# Patient Record
Sex: Female | Born: 1945 | Race: Black or African American | Hispanic: No | Marital: Single | Smoking: Never smoker
Health system: Southern US, Community
[De-identification: ages and names within clinical notes are randomized; demographics above are authoritative.]

## PROBLEM LIST (undated history)

## (undated) DIAGNOSIS — E559 Vitamin D deficiency, unspecified: Secondary | ICD-10-CM

## (undated) DIAGNOSIS — E876 Hypokalemia: Secondary | ICD-10-CM

## (undated) DIAGNOSIS — M069 Rheumatoid arthritis, unspecified: Secondary | ICD-10-CM

## (undated) DIAGNOSIS — C801 Malignant (primary) neoplasm, unspecified: Secondary | ICD-10-CM

## (undated) DIAGNOSIS — E049 Nontoxic goiter, unspecified: Secondary | ICD-10-CM

## (undated) DIAGNOSIS — E039 Hypothyroidism, unspecified: Secondary | ICD-10-CM

## (undated) DIAGNOSIS — M1990 Unspecified osteoarthritis, unspecified site: Secondary | ICD-10-CM

## (undated) DIAGNOSIS — Z973 Presence of spectacles and contact lenses: Secondary | ICD-10-CM

## (undated) HISTORY — PX: CATARACT EXTRACTION W/ INTRAOCULAR LENS IMPLANT: SHX1309

## (undated) HISTORY — PX: ABDOMINAL HYSTERECTOMY: SHX81

## (undated) HISTORY — DX: Nontoxic goiter, unspecified: E04.9

## (undated) HISTORY — DX: Hypothyroidism, unspecified: E03.9

## (undated) HISTORY — DX: Hypokalemia: E87.6

---

## 2013-09-13 ENCOUNTER — Other Ambulatory Visit: Payer: Self-pay | Admitting: Orthopaedic Surgery

## 2013-10-14 ENCOUNTER — Encounter (HOSPITAL_COMMUNITY): Payer: Self-pay | Admitting: Pharmacy Technician

## 2013-10-18 ENCOUNTER — Encounter (HOSPITAL_COMMUNITY)
Admission: RE | Admit: 2013-10-18 | Discharge: 2013-10-18 | Disposition: A | Discharge status: Home / Self Care | Payer: Medicare Other | Source: Ambulatory Visit | Attending: Orthopaedic Surgery | Admitting: Orthopaedic Surgery

## 2013-10-18 ENCOUNTER — Encounter (HOSPITAL_COMMUNITY): Payer: Self-pay

## 2013-10-18 DIAGNOSIS — Z01812 Encounter for preprocedural laboratory examination: Secondary | ICD-10-CM | POA: Diagnosis present

## 2013-10-18 DIAGNOSIS — M169 Osteoarthritis of hip, unspecified: Secondary | ICD-10-CM | POA: Diagnosis present

## 2013-10-18 DIAGNOSIS — M1610 Unilateral primary osteoarthritis, unspecified hip: Secondary | ICD-10-CM | POA: Diagnosis present

## 2013-10-18 DIAGNOSIS — Z01810 Encounter for preprocedural cardiovascular examination: Secondary | ICD-10-CM | POA: Diagnosis present

## 2013-10-18 HISTORY — DX: Malignant (primary) neoplasm, unspecified: C80.1

## 2013-10-18 HISTORY — DX: Vitamin D deficiency, unspecified: E55.9

## 2013-10-18 HISTORY — DX: Rheumatoid arthritis, unspecified: M06.9

## 2013-10-18 HISTORY — DX: Presence of spectacles and contact lenses: Z97.3

## 2013-10-18 HISTORY — DX: Unspecified osteoarthritis, unspecified site: M19.90

## 2013-10-18 LAB — TYPE AND SCREEN
ABO/RH(D): O POS
Antibody Screen: NEGATIVE

## 2013-10-18 LAB — CBC WITH DIFFERENTIAL/PLATELET
BASOS PCT: 0 % (ref 0–1)
Basophils Absolute: 0 10*3/uL (ref 0.0–0.1)
EOS ABS: 0 10*3/uL (ref 0.0–0.7)
EOS PCT: 0 % (ref 0–5)
HCT: 38.5 % (ref 36.0–46.0)
Hemoglobin: 12.8 g/dL (ref 12.0–15.0)
LYMPHS ABS: 0.6 10*3/uL — AB (ref 0.7–4.0)
Lymphocytes Relative: 8 % — ABNORMAL LOW (ref 12–46)
MCH: 31.6 pg (ref 26.0–34.0)
MCHC: 33.2 g/dL (ref 30.0–36.0)
MCV: 95.1 fL (ref 78.0–100.0)
MONOS PCT: 3 % (ref 3–12)
Monocytes Absolute: 0.2 10*3/uL (ref 0.1–1.0)
Neutro Abs: 6.8 10*3/uL (ref 1.7–7.7)
Neutrophils Relative %: 89 % — ABNORMAL HIGH (ref 43–77)
Platelets: 216 10*3/uL (ref 150–400)
RBC: 4.05 MIL/uL (ref 3.87–5.11)
RDW: 12.5 % (ref 11.5–15.5)
WBC: 7.7 10*3/uL (ref 4.0–10.5)

## 2013-10-18 LAB — URINALYSIS, ROUTINE W REFLEX MICROSCOPIC
Glucose, UA: NEGATIVE mg/dL
Hgb urine dipstick: NEGATIVE
Ketones, ur: 15 mg/dL — AB
NITRITE: NEGATIVE
PH: 5 (ref 5.0–8.0)
Protein, ur: NEGATIVE mg/dL
SPECIFIC GRAVITY, URINE: 1.026 (ref 1.005–1.030)
Urobilinogen, UA: 1 mg/dL (ref 0.0–1.0)

## 2013-10-18 LAB — BASIC METABOLIC PANEL
Anion gap: 11 (ref 5–15)
BUN: 12 mg/dL (ref 6–23)
CO2: 27 mEq/L (ref 19–32)
CREATININE: 0.84 mg/dL (ref 0.50–1.10)
Calcium: 9.4 mg/dL (ref 8.4–10.5)
Chloride: 104 mEq/L (ref 96–112)
GFR calc Af Amer: 82 mL/min — ABNORMAL LOW (ref >90)
GFR, EST NON AFRICAN AMERICAN: 70 mL/min — AB (ref >90)
GLUCOSE: 106 mg/dL — AB (ref 70–99)
POTASSIUM: 4 meq/L (ref 3.7–5.3)
Sodium: 142 mEq/L (ref 137–147)

## 2013-10-18 LAB — URINE MICROSCOPIC-ADD ON

## 2013-10-18 LAB — SURGICAL PCR SCREEN
MRSA, PCR: NEGATIVE
STAPHYLOCOCCUS AUREUS: NEGATIVE

## 2013-10-18 LAB — APTT: aPTT: 30 seconds (ref 24–37)

## 2013-10-18 LAB — PROTIME-INR
INR: 1.05 (ref 0.00–1.49)
Prothrombin Time: 13.7 seconds (ref 11.6–15.2)

## 2013-10-18 LAB — ABO/RH: ABO/RH(D): O POS

## 2013-10-19 NOTE — H&P (Signed)
TOTAL HIP ADMISSION H&P  Patient is admitted for left total hip arthroplasty.  Subjective:  Chief Complaint: left hip pain  HPI: Carrie Fitzgerald, 68 y.o. female, has a history of pain and functional disability in the left hip(s) due to arthritis and patient has failed non-surgical conservative treatments for greater than 12 weeks to include NSAID's and/or analgesics, corticosteriod injections, flexibility and strengthening excercises, use of assistive devices, weight reduction as appropriate and activity modification.  Onset of symptoms was gradual starting 5 years ago with gradually worsening course since that time.The patient noted no past surgery on the left hip(s).  Patient currently rates pain in the left hip at 10 out of 10 with activity. Patient has night pain, worsening of pain with activity and weight bearing, pain that interfers with activities of daily living and crepitus. Patient has evidence of subchondral cysts, subchondral sclerosis, periarticular osteophytes and joint space narrowing by imaging studies. This condition presents safety issues increasing the risk of falls. There is no current active infection.  There are no active problems to display for this patient.  Past Medical History  Diagnosis Date  . Rheumatoid arthritis   . Cancer     ovarian  . DJD (degenerative joint disease)     Left hip  . Vitamin D deficiency   . Wears glasses     Past Surgical History  Procedure Laterality Date  . Abdominal hysterectomy    . Cataract extraction w/ intraocular lens implant      Right eye    No prescriptions prior to admission   No Known Allergies  History  Substance Use Topics  . Smoking status: Never Smoker   . Smokeless tobacco: Never Used  . Alcohol Use: No    Family History  Problem Relation Age of Onset  . Heart disease Father   . Arthritis Other      Review of Systems  Musculoskeletal: Positive for joint pain.       Left hip  All other systems reviewed  and are negative.   Objective:  Physical Exam  Constitutional: She is oriented to person, place, and time. She appears well-developed and well-nourished.  HENT:  Head: Normocephalic and atraumatic.  Eyes: Pupils are equal, round, and reactive to light.  Neck: Normal range of motion.  Cardiovascular: Normal rate and regular rhythm.   Respiratory: Effort normal.  GI: Soft.  Musculoskeletal:  Left hip has very limited motion. She has pain on attempted rotation. Her leg lengths are roughly equal. Sensation and motor function are intact in the feet with palpable pulses on both sides. Opposite hip moves well. There is no palpable lymphadenopathy about the groin on either side. She has good lumbosacral motion.  Neurological: She is alert and oriented to person, place, and time.  Skin: Skin is warm and dry.  Psychiatric: She has a normal mood and affect. Her behavior is normal. Judgment and thought content normal.    Vital signs in last 24 hours: Temp:  [99 F (37.2 C)] 99 F (37.2 C) (09/15 1448) Pulse Rate:  [90] 90 (09/15 1448) Resp:  [20] 20 (09/15 1448) BP: (131)/(60) 131/60 mmHg (09/15 1448) SpO2:  [98 %] 98 % (09/15 1448) Weight:  [87.998 kg (194 lb)] 87.998 kg (194 lb) (09/15 1448)  Labs:   There is no height or weight on file to calculate BMI.   Imaging Review Plain radiographs demonstrate severe degenerative joint disease of the left hip(s). The bone quality appears to be good for age and  reported activity level.  Assessment/Plan:  End stage arthritis, left hip(s)  The patient history, physical examination, clinical judgement of the provider and imaging studies are consistent with end stage degenerative joint disease of the left hip(s) and total hip arthroplasty is deemed medically necessary. The treatment options including medical management, injection therapy, arthroscopy and arthroplasty were discussed at length. The risks and benefits of total hip arthroplasty were  presented and reviewed. The risks due to aseptic loosening, infection, stiffness, dislocation/subluxation,  thromboembolic complications and other imponderables were discussed.  The patient acknowledged the explanation, agreed to proceed with the plan and consent was signed. Patient is being admitted for inpatient treatment for surgery, pain control, PT, OT, prophylactic antibiotics, VTE prophylaxis, progressive ambulation and ADL's and discharge planning.The patient is planning to be discharged to skilled nursing facility

## 2013-10-24 MED ORDER — CEFAZOLIN SODIUM-DEXTROSE 2-3 GM-% IV SOLR
2.0000 g | INTRAVENOUS | Status: AC
  Administered 2013-10-25: 2 g via INTRAVENOUS
  Filled 2013-10-24: qty 50

## 2013-10-24 NOTE — Progress Notes (Signed)
Notified of time change;to arrive at 0715-pt verbalized understanding

## 2013-10-24 NOTE — Progress Notes (Signed)
Pt notified of new arrival time of 12:20 PM. Surgery time is now 2:20 PM. Pt voiced understanding.

## 2013-10-25 ENCOUNTER — Encounter (HOSPITAL_COMMUNITY)
Admission: RE | Disposition: A | Discharge status: Skilled Nursing Facility | Payer: Self-pay | Source: Ambulatory Visit | Attending: Orthopaedic Surgery

## 2013-10-25 ENCOUNTER — Inpatient Hospital Stay (HOSPITAL_COMMUNITY)
Admission: RE | Admit: 2013-10-25 | Discharge: 2013-10-28 | DRG: 470 | Disposition: A | Discharge status: Skilled Nursing Facility | Payer: Medicare Other | Source: Ambulatory Visit | Attending: Orthopaedic Surgery | Admitting: Orthopaedic Surgery

## 2013-10-25 ENCOUNTER — Encounter (HOSPITAL_COMMUNITY): Payer: Medicare Other | Admitting: Anesthesiology

## 2013-10-25 ENCOUNTER — Encounter (HOSPITAL_COMMUNITY): Payer: Self-pay | Admitting: Anesthesiology

## 2013-10-25 ENCOUNTER — Inpatient Hospital Stay (HOSPITAL_COMMUNITY): Payer: Medicare Other | Admitting: Anesthesiology

## 2013-10-25 ENCOUNTER — Inpatient Hospital Stay (HOSPITAL_COMMUNITY): Payer: Medicare Other

## 2013-10-25 DIAGNOSIS — M1610 Unilateral primary osteoarthritis, unspecified hip: Secondary | ICD-10-CM | POA: Diagnosis not present

## 2013-10-25 DIAGNOSIS — Z9849 Cataract extraction status, unspecified eye: Secondary | ICD-10-CM | POA: Diagnosis not present

## 2013-10-25 DIAGNOSIS — Z90710 Acquired absence of both cervix and uterus: Secondary | ICD-10-CM

## 2013-10-25 DIAGNOSIS — M169 Osteoarthritis of hip, unspecified: Secondary | ICD-10-CM | POA: Diagnosis present

## 2013-10-25 DIAGNOSIS — M1612 Unilateral primary osteoarthritis, left hip: Secondary | ICD-10-CM

## 2013-10-25 DIAGNOSIS — M069 Rheumatoid arthritis, unspecified: Secondary | ICD-10-CM | POA: Diagnosis present

## 2013-10-25 DIAGNOSIS — E669 Obesity, unspecified: Secondary | ICD-10-CM | POA: Diagnosis present

## 2013-10-25 HISTORY — PX: TOTAL HIP ARTHROPLASTY: SHX124

## 2013-10-25 SURGERY — ARTHROPLASTY, HIP, TOTAL, ANTERIOR APPROACH
Anesthesia: General | Laterality: Left

## 2013-10-25 MED ORDER — MIDAZOLAM HCL 5 MG/5ML IJ SOLN
INTRAMUSCULAR | Status: DC | PRN
  Administered 2013-10-25 (×2): 1 mg via INTRAVENOUS

## 2013-10-25 MED ORDER — ASPIRIN EC 325 MG PO TBEC
325.0000 mg | DELAYED_RELEASE_TABLET | Freq: Two times a day (BID) | ORAL | Status: DC
  Administered 2013-10-26 – 2013-10-28 (×5): 325 mg via ORAL
  Filled 2013-10-25 (×7): qty 1

## 2013-10-25 MED ORDER — DIPHENHYDRAMINE HCL 50 MG/ML IJ SOLN: 10.0000 mg | Freq: Once | INTRAMUSCULAR | Status: DC

## 2013-10-25 MED ORDER — HYDROMORPHONE HCL 1 MG/ML IJ SOLN: 0.5000 mg | INTRAMUSCULAR | Status: DC | PRN

## 2013-10-25 MED ORDER — OXYCODONE HCL 5 MG/5ML PO SOLN: 5.0000 mg | Freq: Once | ORAL | Status: AC | PRN

## 2013-10-25 MED ORDER — METOCLOPRAMIDE HCL 5 MG/ML IJ SOLN: 5.0000 mg | Freq: Three times a day (TID) | INTRAMUSCULAR | Status: DC | PRN

## 2013-10-25 MED ORDER — HYDROCODONE-ACETAMINOPHEN 5-325 MG PO TABS
1.0000 | ORAL_TABLET | ORAL | Status: DC | PRN
  Administered 2013-10-25 – 2013-10-28 (×6): 2 via ORAL
  Filled 2013-10-25 (×6): qty 2

## 2013-10-25 MED ORDER — DEXAMETHASONE SODIUM PHOSPHATE 4 MG/ML IJ SOLN
INTRAMUSCULAR | Status: AC
  Filled 2013-10-25: qty 2

## 2013-10-25 MED ORDER — PREDNISONE 10 MG PO TABS
10.0000 mg | ORAL_TABLET | Freq: Every day | ORAL | Status: DC
  Administered 2013-10-26 – 2013-10-28 (×3): 10 mg via ORAL
  Filled 2013-10-25 (×4): qty 1

## 2013-10-25 MED ORDER — CALCIUM CARBONATE-VITAMIN D 500-200 MG-UNIT PO TABS
1.0000 | ORAL_TABLET | Freq: Every day | ORAL | Status: DC
  Administered 2013-10-26 – 2013-10-28 (×3): 1 via ORAL
  Filled 2013-10-25 (×4): qty 1

## 2013-10-25 MED ORDER — CEFAZOLIN SODIUM-DEXTROSE 2-3 GM-% IV SOLR
2.0000 g | Freq: Four times a day (QID) | INTRAVENOUS | Status: AC
  Administered 2013-10-25 – 2013-10-26 (×2): 2 g via INTRAVENOUS
  Filled 2013-10-25 (×2): qty 50

## 2013-10-25 MED ORDER — OXYCODONE HCL 5 MG PO TABS
ORAL_TABLET | ORAL | Status: AC
  Administered 2013-10-25: 5 mg via ORAL
  Filled 2013-10-25: qty 1

## 2013-10-25 MED ORDER — FOLIC ACID 1 MG PO TABS
1.0000 mg | ORAL_TABLET | Freq: Every day | ORAL | Status: DC
  Administered 2013-10-26 – 2013-10-28 (×3): 1 mg via ORAL
  Filled 2013-10-25 (×3): qty 1

## 2013-10-25 MED ORDER — PHENOL 1.4 % MT LIQD
1.0000 | OROMUCOSAL | Status: DC | PRN
  Filled 2013-10-25: qty 177

## 2013-10-25 MED ORDER — PROPOFOL 10 MG/ML IV BOLUS
INTRAVENOUS | Status: AC
  Filled 2013-10-25: qty 20

## 2013-10-25 MED ORDER — LACTATED RINGERS IV SOLN: INTRAVENOUS | Status: DC

## 2013-10-25 MED ORDER — HYDROCORTISONE NA SUCCINATE PF 100 MG IJ SOLR
100.0000 mg | Freq: Once | INTRAMUSCULAR | Status: AC
  Administered 2013-10-25: .1 g via INTRAVENOUS
  Filled 2013-10-25: qty 2

## 2013-10-25 MED ORDER — METHOCARBAMOL 500 MG PO TABS: 500.0000 mg | ORAL_TABLET | Freq: Four times a day (QID) | ORAL | Status: DC | PRN

## 2013-10-25 MED ORDER — DOCUSATE SODIUM 100 MG PO CAPS
100.0000 mg | ORAL_CAPSULE | Freq: Two times a day (BID) | ORAL | Status: DC
  Administered 2013-10-25 – 2013-10-28 (×6): 100 mg via ORAL
  Filled 2013-10-25 (×9): qty 1

## 2013-10-25 MED ORDER — HYDROMORPHONE HCL 1 MG/ML IJ SOLN
INTRAMUSCULAR | Status: AC
  Administered 2013-10-25: 0.5 mg via INTRAVENOUS
  Filled 2013-10-25: qty 1

## 2013-10-25 MED ORDER — VITAMIN D (ERGOCALCIFEROL) 1.25 MG (50000 UNIT) PO CAPS
50000.0000 [IU] | ORAL_CAPSULE | ORAL | Status: DC
  Administered 2013-10-27: 50000 [IU] via ORAL
  Filled 2013-10-25: qty 1

## 2013-10-25 MED ORDER — ONDANSETRON HCL 4 MG/2ML IJ SOLN
INTRAMUSCULAR | Status: AC
  Filled 2013-10-25: qty 2

## 2013-10-25 MED ORDER — TRANEXAMIC ACID 100 MG/ML IV SOLN
1000.0000 mg | INTRAVENOUS | Status: AC
  Administered 2013-10-25: 1000 mg via INTRAVENOUS
  Filled 2013-10-25: qty 10

## 2013-10-25 MED ORDER — PROPOFOL 10 MG/ML IV BOLUS
INTRAVENOUS | Status: DC | PRN
  Administered 2013-10-25: 100 mg via INTRAVENOUS

## 2013-10-25 MED ORDER — MIDAZOLAM HCL 2 MG/2ML IJ SOLN: 0.5000 mg | Freq: Once | INTRAMUSCULAR | Status: DC | PRN

## 2013-10-25 MED ORDER — MENTHOL 3 MG MT LOZG
1.0000 | LOZENGE | OROMUCOSAL | Status: DC | PRN
  Filled 2013-10-25: qty 9

## 2013-10-25 MED ORDER — CHLORHEXIDINE GLUCONATE 4 % EX LIQD: 60.0000 mL | Freq: Once | CUTANEOUS | Status: DC

## 2013-10-25 MED ORDER — EPHEDRINE SULFATE 50 MG/ML IJ SOLN
INTRAMUSCULAR | Status: AC
  Filled 2013-10-25: qty 1

## 2013-10-25 MED ORDER — LACTATED RINGERS IV SOLN
INTRAVENOUS | Status: DC | PRN
  Administered 2013-10-25 (×3): via INTRAVENOUS

## 2013-10-25 MED ORDER — OXYCODONE HCL 5 MG PO TABS
5.0000 mg | ORAL_TABLET | Freq: Once | ORAL | Status: AC | PRN
  Administered 2013-10-25: 5 mg via ORAL

## 2013-10-25 MED ORDER — HYDROXYCHLOROQUINE SULFATE 200 MG PO TABS
400.0000 mg | ORAL_TABLET | Freq: Every day | ORAL | Status: DC
  Administered 2013-10-26 – 2013-10-28 (×3): 400 mg via ORAL
  Filled 2013-10-25 (×3): qty 2

## 2013-10-25 MED ORDER — CALCIUM CARBONATE-VITAMIN D 600-400 MG-UNIT PO TABS: 1.0000 | ORAL_TABLET | Freq: Every day | ORAL | Status: DC

## 2013-10-25 MED ORDER — LIDOCAINE HCL (CARDIAC) 20 MG/ML IV SOLN
INTRAVENOUS | Status: DC | PRN
  Administered 2013-10-25: 200 mg via INTRAVENOUS

## 2013-10-25 MED ORDER — BISACODYL 5 MG PO TBEC
5.0000 mg | DELAYED_RELEASE_TABLET | Freq: Every day | ORAL | Status: DC | PRN
  Administered 2013-10-28: 5 mg via ORAL
  Filled 2013-10-25: qty 1

## 2013-10-25 MED ORDER — 0.9 % SODIUM CHLORIDE (POUR BTL) OPTIME
TOPICAL | Status: DC | PRN
  Administered 2013-10-25: 1000 mL

## 2013-10-25 MED ORDER — FENTANYL CITRATE 0.05 MG/ML IJ SOLN
INTRAMUSCULAR | Status: DC | PRN
  Administered 2013-10-25: 50 ug via INTRAVENOUS
  Administered 2013-10-25: 100 ug via INTRAVENOUS
  Administered 2013-10-25 (×3): 50 ug via INTRAVENOUS
  Administered 2013-10-25 (×2): 100 ug via INTRAVENOUS

## 2013-10-25 MED ORDER — NEOSTIGMINE METHYLSULFATE 10 MG/10ML IV SOLN
INTRAVENOUS | Status: DC | PRN
  Administered 2013-10-25: 4 mg via INTRAVENOUS

## 2013-10-25 MED ORDER — ACETAMINOPHEN 650 MG RE SUPP: 650.0000 mg | Freq: Four times a day (QID) | RECTAL | Status: DC | PRN

## 2013-10-25 MED ORDER — ROCURONIUM BROMIDE 100 MG/10ML IV SOLN
INTRAVENOUS | Status: DC | PRN
  Administered 2013-10-25: 50 mg via INTRAVENOUS

## 2013-10-25 MED ORDER — ONDANSETRON HCL 4 MG PO TABS
4.0000 mg | ORAL_TABLET | Freq: Four times a day (QID) | ORAL | Status: DC | PRN
Start: 2013-10-25 — End: 2013-10-28

## 2013-10-25 MED ORDER — METHOCARBAMOL 1000 MG/10ML IJ SOLN
500.0000 mg | Freq: Four times a day (QID) | INTRAVENOUS | Status: DC | PRN
  Filled 2013-10-25: qty 5

## 2013-10-25 MED ORDER — SCOPOLAMINE 1 MG/3DAYS TD PT72
1.0000 | MEDICATED_PATCH | Freq: Once | TRANSDERMAL | Status: AC
  Administered 2013-10-25: 1 via TRANSDERMAL

## 2013-10-25 MED ORDER — ACETAMINOPHEN 325 MG PO TABS: 650.0000 mg | ORAL_TABLET | Freq: Four times a day (QID) | ORAL | Status: DC | PRN

## 2013-10-25 MED ORDER — BIOTIN 1000 MCG PO TABS: 1000.0000 ug | ORAL_TABLET | Freq: Every day | ORAL | Status: DC

## 2013-10-25 MED ORDER — LACTATED RINGERS IV SOLN
INTRAVENOUS | Status: DC
  Administered 2013-10-25: 20:00:00 via INTRAVENOUS

## 2013-10-25 MED ORDER — FENTANYL CITRATE 0.05 MG/ML IJ SOLN
INTRAMUSCULAR | Status: AC
  Filled 2013-10-25: qty 5

## 2013-10-25 MED ORDER — STERILE WATER FOR INJECTION IJ SOLN
INTRAMUSCULAR | Status: AC
  Filled 2013-10-25: qty 10

## 2013-10-25 MED ORDER — METHOTREXATE 2.5 MG PO TABS
15.0000 mg | ORAL_TABLET | ORAL | Status: DC
  Administered 2013-10-27: 15 mg via ORAL
  Filled 2013-10-25: qty 6

## 2013-10-25 MED ORDER — ALUM & MAG HYDROXIDE-SIMETH 200-200-20 MG/5ML PO SUSP: 30.0000 mL | ORAL | Status: DC | PRN

## 2013-10-25 MED ORDER — HYDROMORPHONE HCL 1 MG/ML IJ SOLN
0.2500 mg | INTRAMUSCULAR | Status: DC | PRN
  Administered 2013-10-25 (×3): 0.5 mg via INTRAVENOUS

## 2013-10-25 MED ORDER — PROMETHAZINE HCL 25 MG/ML IJ SOLN: 6.2500 mg | INTRAMUSCULAR | Status: DC | PRN

## 2013-10-25 MED ORDER — GLYCOPYRROLATE 0.2 MG/ML IJ SOLN
INTRAMUSCULAR | Status: DC | PRN
  Administered 2013-10-25: .6 mg via INTRAVENOUS

## 2013-10-25 MED ORDER — DIPHENHYDRAMINE HCL 12.5 MG/5ML PO ELIX
12.5000 mg | ORAL_SOLUTION | ORAL | Status: DC | PRN
  Filled 2013-10-25: qty 10

## 2013-10-25 MED ORDER — ONDANSETRON HCL 4 MG/2ML IJ SOLN
INTRAMUSCULAR | Status: DC | PRN
  Administered 2013-10-25: 4 mg via INTRAVENOUS

## 2013-10-25 MED ORDER — ONDANSETRON HCL 4 MG/2ML IJ SOLN: 4.0000 mg | Freq: Four times a day (QID) | INTRAMUSCULAR | Status: DC | PRN

## 2013-10-25 MED ORDER — METOCLOPRAMIDE HCL 5 MG PO TABS: 5.0000 mg | ORAL_TABLET | Freq: Three times a day (TID) | ORAL | Status: DC | PRN

## 2013-10-25 MED ORDER — MIDAZOLAM HCL 2 MG/2ML IJ SOLN
INTRAMUSCULAR | Status: AC
Start: 2013-10-25 — End: 2013-10-25
  Filled 2013-10-25: qty 2

## 2013-10-25 MED ORDER — MEPERIDINE HCL 25 MG/ML IJ SOLN: 6.2500 mg | INTRAMUSCULAR | Status: DC | PRN

## 2013-10-25 MED ORDER — POTASSIUM CHLORIDE CRYS ER 20 MEQ PO TBCR
20.0000 meq | EXTENDED_RELEASE_TABLET | Freq: Every day | ORAL | Status: DC
  Administered 2013-10-26 – 2013-10-28 (×3): 20 meq via ORAL
  Filled 2013-10-25 (×5): qty 1

## 2013-10-25 MED ORDER — DIPHENHYDRAMINE HCL 50 MG/ML IJ SOLN
INTRAMUSCULAR | Status: DC | PRN
  Administered 2013-10-25: 10 mg via INTRAVENOUS

## 2013-10-25 SURGICAL SUPPLY — 45 items
BLADE SAW SGTL 18X1.27X75 (BLADE) ×2 IMPLANT
BLADE SAW SGTL 18X1.27X75MM (BLADE) ×1
BLADE SURG ROTATE 9660 (MISCELLANEOUS) IMPLANT
CAPT HIP PF MOP ×3 IMPLANT
CELLS DAT CNTRL 66122 CELL SVR (MISCELLANEOUS) ×1 IMPLANT
COVER PERINEAL POST (MISCELLANEOUS) ×3 IMPLANT
COVER SURGICAL LIGHT HANDLE (MISCELLANEOUS) ×3 IMPLANT
DRAPE C-ARM 42X72 X-RAY (DRAPES) ×3 IMPLANT
DRAPE STERI IOBAN 125X83 (DRAPES) ×3 IMPLANT
DRAPE U-SHAPE 47X51 STRL (DRAPES) ×9 IMPLANT
DRSG AQUACEL AG ADV 3.5X10 (GAUZE/BANDAGES/DRESSINGS) ×3 IMPLANT
DURAPREP 26ML APPLICATOR (WOUND CARE) ×3 IMPLANT
ELECT BLADE 4.0 EZ CLEAN MEGAD (MISCELLANEOUS) ×3
ELECT CAUTERY BLADE 6.4 (BLADE) ×3 IMPLANT
ELECT REM PT RETURN 9FT ADLT (ELECTROSURGICAL) ×3
ELECTRODE BLDE 4.0 EZ CLN MEGD (MISCELLANEOUS) ×1 IMPLANT
ELECTRODE REM PT RTRN 9FT ADLT (ELECTROSURGICAL) ×1 IMPLANT
FACESHIELD WRAPAROUND (MASK) ×9 IMPLANT
GLOVE BIO SURGEON STRL SZ8 (GLOVE) ×6 IMPLANT
GLOVE BIOGEL PI IND STRL 8 (GLOVE) ×2 IMPLANT
GLOVE BIOGEL PI INDICATOR 8 (GLOVE) ×4
GOWN STRL REUS W/ TWL LRG LVL3 (GOWN DISPOSABLE) ×4 IMPLANT
GOWN STRL REUS W/ TWL XL LVL3 (GOWN DISPOSABLE) ×2 IMPLANT
GOWN STRL REUS W/TWL LRG LVL3 (GOWN DISPOSABLE) ×8
GOWN STRL REUS W/TWL XL LVL3 (GOWN DISPOSABLE) ×4
KIT BASIN OR (CUSTOM PROCEDURE TRAY) ×3 IMPLANT
KIT ROOM TURNOVER OR (KITS) ×3 IMPLANT
LINER BOOT UNIVERSAL DISP (MISCELLANEOUS) ×3 IMPLANT
MANIFOLD NEPTUNE II (INSTRUMENTS) ×3 IMPLANT
NS IRRIG 1000ML POUR BTL (IV SOLUTION) ×3 IMPLANT
PACK TOTAL JOINT (CUSTOM PROCEDURE TRAY) ×3 IMPLANT
PAD ARMBOARD 7.5X6 YLW CONV (MISCELLANEOUS) ×6 IMPLANT
RTRCTR WOUND ALEXIS 18CM MED (MISCELLANEOUS) ×3
STAPLER VISISTAT 35W (STAPLE) ×3 IMPLANT
SUT ETHIBOND NAB CT1 #1 30IN (SUTURE) ×9 IMPLANT
SUT VIC AB 0 CT1 27 (SUTURE)
SUT VIC AB 0 CT1 27XBRD ANBCTR (SUTURE) IMPLANT
SUT VIC AB 1 CT1 27 (SUTURE) ×2
SUT VIC AB 1 CT1 27XBRD ANBCTR (SUTURE) ×1 IMPLANT
SUT VIC AB 2-0 CT1 27 (SUTURE) ×2
SUT VIC AB 2-0 CT1 TAPERPNT 27 (SUTURE) ×1 IMPLANT
SUT VLOC 180 0 24IN GS25 (SUTURE) ×3 IMPLANT
TOWEL OR 17X24 6PK STRL BLUE (TOWEL DISPOSABLE) ×3 IMPLANT
TOWEL OR 17X26 10 PK STRL BLUE (TOWEL DISPOSABLE) ×6 IMPLANT
TRAY FOLEY CATH 14FR (SET/KITS/TRAYS/PACK) IMPLANT

## 2013-10-25 NOTE — Transfer of Care (Signed)
Immediate Anesthesia Transfer of Care Note  Patient: Carrie Fitzgerald  Procedure(s) Performed: Procedure(s): TOTAL HIP ARTHROPLASTY ANTERIOR APPROACH (Left)  Patient Location: PACU  Anesthesia Type:General  Level of Consciousness: sedated  Airway & Oxygen Therapy: Patient Spontanous Breathing and Patient connected to nasal cannula oxygen  Post-op Assessment: Report given to PACU RN and Post -op Vital signs reviewed and stable  Post vital signs: Reviewed and stable  Complications: No apparent anesthesia complications

## 2013-10-25 NOTE — Progress Notes (Signed)
Utilization review completed.  

## 2013-10-25 NOTE — Op Note (Signed)
PRE-OP DIAGNOSIS:  LEFT HIP DEGENERATIVE JOINT DISEASE POST-OP DIAGNOSIS: same PROCEDURE:  LEFT TOTAL HIP ARTHROPLASTY ANTERIOR APPROACH ANESTHESIA:  General SURGEON:  Melrose Nakayama MD ASSISTANT:  Loni Dolly PA-C   INDICATIONS FOR PROCEDURE:  The patient is a 68 y.o. female with a long history of a painful hip.  This has persisted despite multiple conservative measures.  The patient has persisted with pain and dysfunction making rest and activity difficult.  A total hip replacement is offered as surgical treatment.  Informed operative consent was obtained after discussion of possible complications including reaction to anesthesia, infection, neurovascular injury, dislocation, DVT, PE, and death.  The importance of the postoperative rehab program to optimize result was stressed with the patient.  SUMMARY OF FINDINGS AND PROCEDURE:  Under general anesthesia through a anterior approach an the Hana table a left THR was performed.  The patient had severe degenerative change and good bone quality.  We used DePuy components to replace the hip and these were size KA 13 Corail femur capped with a +1.5 36 mm stainless steel hip ball.  On the acetabular side we used a size 52 Gription shell with a plus 4 neutral polyethylene liner.  We did use a hole eliminator.  Loni Dolly PA-C assisted throughout and was invaluable to the completion of the case in that he helped position and retract while I performed the procedure.  He also closed simultaneously to help minimize OR time.  I used fluoroscopy throughout the case to check position of components and leg lengths and read all these views myself.  DESCRIPTION OF PROCEDURE:  The patient was taken to the OR suite where general anesthetic was applied.  The patient was then positioned on the Hana table supine.  All bony prominences were appropriately padded.  Prep and drape was then performed in normal sterile fashion.  The patient was given Kefzol preoperative  antibiotic and an appropriate time out was performed.  We then took an anterior approach to the left hip.  Dissection was taken through adipose to the tensor fascia lata fascia.  This structure was incised longitudinally and we dissected in the intermuscular interval just medial to this muscle.  Cobra retractors were placed superior and inferior to the femoral neck superficial to the capsule.  A capsular incision was then made and the retractors were placed along the femoral neck.  Xray was brought in to get a good level for the femoral neck cut which was made with an oscillating saw and osteotome.  The femoral head was removed with a corkscrew.  The acetabulum was exposed and some labral tissues were excised. Reaming was taken to the inside wall of the pelvis and sequentially up to 1 mm smaller than the actual component.  A trial of components was done and then the aforementioned acetabular shell was placed in appropriate tilt and anteversion confirmed by fluoroscopy. The liner was placed along with the hole eliminator and attention was turned to the femur.  The leg was brought down and over into adduction and the elevator bar was used to raise the femur up gently in the wound.  The piriformis was released with care taken to preserve the obturator internus attachment and all of the posterior capsule. The femur was reamed and then broached to the appropriate size.  A trial reduction was done and the aforementioned head and neck assembly gave [place] the best stability in extension with external rotation.  Leg lengths were felt to be about equal by fluoroscopic exam.  The trial components were removed and the wound irrigated.  We then placed the femoral component in appropriate anteversion.  The head was applied to a dry stem neck and the hip again reduced.  It was again stable in the aforementioned position.  The would was irrigated again followed by re-approximation of anterior capsule with ethibond suture. Tensor  fascia was repaired with V-loc suture  followed by subcutaneous closure with #O and #2 undyed vicryl.  Skin was closed with staples followed by a sterile dressing.  EBL and IOF can be obtained from anesthesia records.  DISPOSITION:  The patient was extubated in the OR and taken to PACU in stable condition to be admitted to the Orthopedic Surgery for appropriate post-op care to include perioperative antibiotics and DVT prophylaxis.

## 2013-10-25 NOTE — Anesthesia Postprocedure Evaluation (Signed)
Anesthesia Post Note  Patient: Carrie Fitzgerald  Procedure(s) Performed: Procedure(s) (LRB): TOTAL HIP ARTHROPLASTY ANTERIOR APPROACH (Left)  Anesthesia type: general  Patient location: PACU  Post pain: Pain level controlled  Post assessment: Patient's Cardiovascular Status Stable  Last Vitals:  Filed Vitals:   10/25/13 1700  BP:   Pulse: 68  Temp:   Resp: 12    Post vital signs: Reviewed and stable  Level of consciousness: sedated  Complications: No apparent anesthesia complications

## 2013-10-25 NOTE — Interval H&P Note (Signed)
History and Physical Interval Note:  10/25/2013 1:28 PM  Carrie Fitzgerald  has presented today for surgery, with the diagnosis of LEFT HIP DEGENERATIVE JOINT DISEASE  The various methods of treatment have been discussed with the patient and family. After consideration of risks, benefits and other options for treatment, the patient has consented to  Procedure(s): TOTAL HIP ARTHROPLASTY ANTERIOR APPROACH (Left) as a surgical intervention .  The patient's history has been reviewed, patient examined, no change in status, stable for surgery.  I have reviewed the patient's chart and labs.  Questions were answered to the patient's satisfaction.     Jesten Cappuccio G

## 2013-10-25 NOTE — Anesthesia Preprocedure Evaluation (Addendum)
Anesthesia Evaluation  Patient identified by MRN, date of birth, ID band Patient awake    Reviewed: Allergy & Precautions, H&P , NPO status , Patient's Chart, lab work & pertinent test results  History of Anesthesia Complications Negative for: history of anesthetic complications  Airway Mallampati: I TM Distance: >3 FB Neck ROM: Full    Dental  (+) Dental Advisory Given, Chipped   Pulmonary neg pulmonary ROS,  breath sounds clear to auscultation  Pulmonary exam normal       Cardiovascular negative cardio ROS  Rhythm:Regular Rate:Normal     Neuro/Psych negative neurological ROS  negative psych ROS   GI/Hepatic negative GI ROS, Neg liver ROS,   Endo/Other  Morbid obesity  Renal/GU negative Renal ROS     Musculoskeletal  (+) Arthritis -, Rheumatoid disorders and on steriods ,    Abdominal (+) + obese,   Peds  Hematology negative hematology ROS (+)   Anesthesia Other Findings   Reproductive/Obstetrics                         Anesthesia Physical Anesthesia Plan  ASA: II  Anesthesia Plan: General   Post-op Pain Management:    Induction: Intravenous  Airway Management Planned: Oral ETT  Additional Equipment:   Intra-op Plan:   Post-operative Plan: Extubation in OR  Informed Consent: I have reviewed the patients History and Physical, chart, labs and discussed the procedure including the risks, benefits and alternatives for the proposed anesthesia with the patient or authorized representative who has indicated his/her understanding and acceptance.   Dental advisory given  Plan Discussed with: CRNA and Surgeon  Anesthesia Plan Comments: (Plan routine monitors, GETA)        Anesthesia Quick Evaluation

## 2013-10-25 NOTE — Anesthesia Procedure Notes (Signed)
Procedure Name: Intubation Date/Time: 10/25/2013 2:03 PM Performed by: Octavio Graves Pre-anesthesia Checklist: Patient identified, Timeout performed, Emergency Drugs available, Suction available and Patient being monitored Patient Re-evaluated:Patient Re-evaluated prior to inductionOxygen Delivery Method: Circle system utilized Preoxygenation: Pre-oxygenation with 100% oxygen Intubation Type: IV induction Ventilation: Mask ventilation without difficulty Laryngoscope Size: Miller and 2 Tube type: Oral Tube size: 7.0 mm Number of attempts: 1 Airway Equipment and Method: Stylet Placement Confirmation: ETT inserted through vocal cords under direct vision,  positive ETCO2 and breath sounds checked- equal and bilateral Secured at: 22 cm Tube secured with: Tape Dental Injury: Teeth and Oropharynx as per pre-operative assessment  Comments: IV induction Jackson- intubation AM CRNA atraumatic- teeth and mouth as preop- chipped front teeth right > left prior to laryngoscopy- present preop- bilat BS Glennon Mac

## 2013-10-26 ENCOUNTER — Encounter (HOSPITAL_COMMUNITY): Payer: Self-pay | Admitting: Orthopaedic Surgery

## 2013-10-26 LAB — BASIC METABOLIC PANEL
Anion gap: 9 (ref 5–15)
BUN: 8 mg/dL (ref 6–23)
CHLORIDE: 101 meq/L (ref 96–112)
CO2: 29 mEq/L (ref 19–32)
CREATININE: 0.83 mg/dL (ref 0.50–1.10)
Calcium: 9.1 mg/dL (ref 8.4–10.5)
GFR calc Af Amer: 83 mL/min — ABNORMAL LOW (ref >90)
GFR calc non Af Amer: 71 mL/min — ABNORMAL LOW (ref >90)
GLUCOSE: 114 mg/dL — AB (ref 70–99)
POTASSIUM: 3.6 meq/L — AB (ref 3.7–5.3)
Sodium: 139 mEq/L (ref 137–147)

## 2013-10-26 LAB — CBC
HCT: 34 % — ABNORMAL LOW (ref 36.0–46.0)
HEMOGLOBIN: 11.3 g/dL — AB (ref 12.0–15.0)
MCH: 31.2 pg (ref 26.0–34.0)
MCHC: 33.2 g/dL (ref 30.0–36.0)
MCV: 93.9 fL (ref 78.0–100.0)
Platelets: 180 10*3/uL (ref 150–400)
RBC: 3.62 MIL/uL — ABNORMAL LOW (ref 3.87–5.11)
RDW: 12.7 % (ref 11.5–15.5)
WBC: 9.4 10*3/uL (ref 4.0–10.5)

## 2013-10-26 MED ORDER — INFLUENZA VAC SPLIT QUAD 0.5 ML IM SUSY
0.5000 mL | PREFILLED_SYRINGE | INTRAMUSCULAR | Status: AC
  Administered 2013-10-27: 0.5 mL via INTRAMUSCULAR
  Filled 2013-10-26: qty 0.5

## 2013-10-26 NOTE — Evaluation (Signed)
Physical Therapy Evaluation Patient Details Name: Carrie Fitzgerald MRN: [id number] DOB: Jan 19, 1946 Today's Date: 10/26/2013   History of Present Illness  S/p L THA Direct Anterior, WBAT, no Hip Prec  Clinical Impression  Pt is s/p THA resulting in the deficits listed below (see PT Problem List).  Pt will benefit from skilled PT to increase their independence and safety with mobility to allow discharge to the venue listed below.        Follow Up Recommendations SNF    Equipment Recommendations  Rolling walker with 5" wheels;3in1 (PT)    Recommendations for Other Services       Precautions / Restrictions Precautions Precautions: None Restrictions LLE Weight Bearing: Weight bearing as tolerated      Mobility  Bed Mobility Overal bed mobility: Needs Assistance Bed Mobility: Supine to Sit     Supine to sit: Min assist     General bed mobility comments: cues for technqiue  Transfers Overall transfer level: Needs assistance Equipment used: Rolling walker (2 wheeled) Transfers: Sit to/from Stand Sit to Stand: Min guard         General transfer comment: Cues for safety, hand placement, and to position LLE for comfort during transfers  Ambulation/Gait Ambulation/Gait assistance: Min guard Ambulation Distance (Feet): 25 Feet Assistive device: Rolling walker (2 wheeled) Gait Pattern/deviations: Step-through pattern (emerging) Gait velocity: slowed   General Gait Details: Cues for sequence, and to bear down into RW to [employer] painful L hip  Stairs            Wheelchair Mobility    Modified Rankin (Stroke Patients Only)       Balance Overall balance assessment: No apparent balance deficits (not formally assessed)                                           Pertinent Vitals/Pain Pain Assessment: 0-10 Pain Score: 4  Pain Location: L Hip Pain Descriptors / Indicators: Sore Pain Intervention(s): Monitored during  session;Repositioned;Premedicated before session    Home Living Family/patient expects to be discharged to:: Skilled nursing facility                      Prior Function Level of Independence: Independent         Comments: Lives alone; works as a Optician, dispensing        Extremity/Trunk Assessment   Upper Extremity Assessment: Overall WFL for tasks assessed           Lower Extremity Assessment: LLE deficits/detail   LLE Deficits / Details: Grossly decr AROM and strength, limited by soreness postop     Communication   Communication: No difficulties  Cognition Arousal/Alertness: Awake/alert Behavior During Therapy: WFL for tasks assessed/performed Overall Cognitive Status: Within Functional Limits for tasks assessed                      General Comments      Exercises        Assessment/Plan    PT Assessment Patient needs continued PT services  PT Diagnosis Difficulty walking;Acute pain   PT Problem List Decreased strength;Decreased range of motion;Decreased activity tolerance;Decreased balance;Decreased mobility;Decreased knowledge of use of DME;Pain  PT Treatment Interventions DME instruction;Gait training;Stair training;Functional mobility training;Therapeutic activities;Therapeutic exercise;Patient/family education   PT Goals (Current goals can be found in the Care Plan section) Acute  Rehab PT Goals Patient Stated Goal: to walk without pain PT Goal Formulation: With patient Time For Goal Achievement: 11/02/13 Potential to Achieve Goals: Good    Frequency 7X/week   Barriers to discharge Decreased caregiver support lives alon; flight of steps to get to bedroom; planning in short-tern SNF stay    Co-evaluation               End of Session Equipment Utilized During Treatment: Gait belt Activity Tolerance: Patient tolerated treatment well Patient left: in chair;with call bell/phone within reach Nurse Communication:  Mobility status         Time: 8502-7741 PT Time Calculation (min): 25 min   Charges:   PT Evaluation $Initial PT Evaluation Tier I: 1 Procedure PT Treatments $Gait Training: 8-22 mins   PT G Codes:          Quin Hoop 10/26/2013, 11:24 AM  Roney Marion, [place] Pager [phone] Office [phone]

## 2013-10-26 NOTE — Progress Notes (Signed)
Admission questions deferred to morning due to patient sedation from surgery.

## 2013-10-26 NOTE — Progress Notes (Signed)
OT Cancellation Note  Patient Details Name: Carrie Fitzgerald MRN: [id number] DOB: 09/15/1945   Cancelled Treatment:    Reason Eval/Treat Not Completed: Other (comment) Pt is Medicare and current D/C plan is SNF. No apparent immediate acute care OT needs, therefore will defer OT to SNF. If OT eval is needed please call Acute Rehab Dept. at [phone] or text page OT at [phone].    Villa Herb M  Cyndie Chime, OTR/L Occupational Therapist [phone] 10/26/2013, 1:52 PM

## 2013-10-26 NOTE — Progress Notes (Signed)
Physical Therapy Treatment Note  Focus of pm session today was therex; which pt performed well with min verbal and tactile cues for form;  Pt indicated to me that a SNF called [facility] (?) is acceptable to her for postacute rehab;   10/26/13 1400  PT Visit Information  Last PT Received On 10/26/13  Assistance Needed +1  History of Present Illness S/p L THA Direct Anterior, WBAT, no Hip Prec  PT Time Calculation  PT Start Time 1403  PT Stop Time 1424  PT Time Calculation (min) 21 min  Subjective Data  Patient Stated Goal to walk without pain  Precautions  Precautions None  Restrictions  LLE Weight Bearing WBAT  Pain Assessment  Pain Assessment 0-10  Pain Score 5  Pain Location L hip  Pain Descriptors / Indicators Sore  Pain Intervention(s) Limited activity within patient's tolerance;Patient requesting pain meds-RN notified  Cognition  Arousal/Alertness Awake/alert  Behavior During Therapy WFL for tasks assessed/performed  Overall Cognitive Status Within Functional Limits for tasks assessed  Exercises  Exercises Total Joint  Total Joint Exercises  Ankle Circles/Pumps AROM;Both;20 reps  Quad Sets AROM;Left;10 reps  Gluteal Sets AROM;Both;10 reps  Towel Squeeze AROM;Both;10 reps  Short Arc Quad AROM;Left;10 reps  Heel Slides AAROM;Left;10 reps  Hip ABduction/ADduction AAROM;Left;10 reps  PT - End of Session  Activity Tolerance Patient tolerated treatment well  Patient left in bed;with call bell/phone within reach  Nurse Communication Mobility status;Patient requests pain meds  PT - Assessment/Plan  PT Plan Current plan remains appropriate  PT Frequency 7X/week  Follow Up Recommendations SNF  PT equipment Rolling walker with 5" wheels;3in1 (PT)  PT Goal Progression  Progress towards PT goals Progressing toward goals  Acute Rehab PT Goals  PT Goal Formulation With patient  Time For Goal Achievement 11/02/13  Potential to Achieve Goals Good  PT General Charges   $$ ACUTE PT VISIT 1 Procedure  PT Treatments  $Therapeutic Exercise 8-22 mins   Roney Marion, PT  Acute Rehabilitation Services Pager [phone] Office [phone]

## 2013-10-26 NOTE — Progress Notes (Signed)
Subjective: 1 Day Post-Op Procedure(s) (LRB): TOTAL HIP ARTHROPLASTY ANTERIOR APPROACH (Left)  Activity level:  wbat Diet tolerance: ok Voiding:  ok Patient reports pain as mild.    Objective: Vital signs in last 24 hours: Temp:  [98 F (36.7 C)-99 F (37.2 C)] 99 F (37.2 C) (09/23 0624) Pulse Rate:  [63-88] 64 (09/23 0624) Resp:  [11-20] 17 (09/23 0624) BP: (136-173)/(53-90) 144/73 mmHg (09/23 0624) SpO2:  [99 %-100 %] 100 % (09/23 0624) Weight:  [87.998 kg (194 lb)] 87.998 kg (194 lb) (09/22 1044)  Labs: No results found for this basename: HGB,  in the last 72 hours No results found for this basename: WBC, RBC, HCT, PLT,  in the last 72 hours No results found for this basename: NA, K, CL, CO2, BUN, CREATININE, GLUCOSE, CALCIUM,  in the last 72 hours No results found for this basename: LABPT, INR,  in the last 72 hours  Physical Exam:  Neurologically intact ABD soft Neurovascular intact Sensation intact distally Intact pulses distally Dorsiflexion/Plantar flexion intact Incision: dressing C/D/I No cellulitis present Compartment soft  Assessment/Plan:  1 Day Post-Op Procedure(s) (LRB): TOTAL HIP ARTHROPLASTY ANTERIOR APPROACH (Left) Advance diet Up with therapy D/C IV fluids Plan for discharge tomorrow Discharge to SNF if continuing to do well and cleared by PT. Continue on ASA 325 mg BID x 4 weeks post op for DVT prevention. Follow up in office 2 weeks post op.    Renly Guedes, [place] Sachs 10/26/2013, 7:44 AM

## 2013-10-27 DIAGNOSIS — M1610 Unilateral primary osteoarthritis, unspecified hip: Secondary | ICD-10-CM | POA: Diagnosis not present

## 2013-10-27 DIAGNOSIS — M169 Osteoarthritis of hip, unspecified: Secondary | ICD-10-CM | POA: Diagnosis not present

## 2013-10-27 LAB — ALT: ALT: 9 U/L (ref 0–35)

## 2013-10-27 LAB — CBC
HCT: 34.3 % — ABNORMAL LOW (ref 36.0–46.0)
HEMOGLOBIN: 11.5 g/dL — AB (ref 12.0–15.0)
MCH: 31.6 pg (ref 26.0–34.0)
MCHC: 33.5 g/dL (ref 30.0–36.0)
MCV: 94.2 fL (ref 78.0–100.0)
PLATELETS: 185 10*3/uL (ref 150–400)
RBC: 3.64 MIL/uL — ABNORMAL LOW (ref 3.87–5.11)
RDW: 12.7 % (ref 11.5–15.5)
WBC: 11 10*3/uL — ABNORMAL HIGH (ref 4.0–10.5)

## 2013-10-27 LAB — BILIRUBIN, TOTAL: Total Bilirubin: 0.8 mg/dL (ref 0.3–1.2)

## 2013-10-27 LAB — AST: AST: 20 U/L (ref 0–37)

## 2013-10-27 MED ORDER — METHOCARBAMOL 500 MG PO TABS: 500.0000 mg | ORAL_TABLET | Freq: Four times a day (QID) | ORAL | Status: DC | PRN

## 2013-10-27 MED ORDER — HYDROCODONE-ACETAMINOPHEN 5-325 MG PO TABS: 1.0000 | ORAL_TABLET | ORAL | Status: DC | PRN

## 2013-10-27 MED ORDER — ASPIRIN 325 MG PO TBEC: 325.0000 mg | DELAYED_RELEASE_TABLET | Freq: Two times a day (BID) | ORAL | Status: DC
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
## 2013-10-27 NOTE — Progress Notes (Signed)
Initial visit with Ms. Chrissie Noa while making rounds. Made aware of services and availability.  No support needed at this time. Will page if needed/desired.   Delford Field 10/27/2013 11:13 AM

## 2013-10-27 NOTE — Progress Notes (Signed)
Physical Therapy Treatment Patient Details Name: Carrie Fitzgerald MRN: [id number] DOB: 1945-07-09 Today's Date: 10/27/2013    History of Present Illness S/p L THA Direct Anterior, WBAT, no Hip Prec    PT Comments    *Progressing well with mobility. Pt walked 120' with RW and performed L THA exercises well. **  Follow Up Recommendations  SNF     Equipment Recommendations  Rolling walker with 5" wheels;3in1 (PT)    Recommendations for Other Services       Precautions / Restrictions Precautions Precautions: None Restrictions LLE Weight Bearing: Weight bearing as tolerated    Mobility  Bed Mobility Overal bed mobility: Needs Assistance Bed Mobility: Supine to Sit     Supine to sit: Min assist;HOB elevated     General bed mobility comments: cues for technqiue, assist for LLE  Transfers Overall transfer level: Needs assistance Equipment used: Rolling walker (2 wheeled) Transfers: Sit to/from Stand Sit to Stand: Min guard         General transfer comment: Cues for safety, hand placement, and to position LLE for comfort during transfers  Ambulation/Gait Ambulation/Gait assistance: Supervision Ambulation Distance (Feet): 120 Feet Assistive device: Rolling walker (2 wheeled) Gait Pattern/deviations: Step-through pattern Gait velocity: decreased   General Gait Details: good sequencing, cues to lift head   Stairs            Wheelchair Mobility    Modified Rankin (Stroke Patients Only)       Balance                                    Cognition Arousal/Alertness: Awake/alert Behavior During Therapy: WFL for tasks assessed/performed Overall Cognitive Status: Within Functional Limits for tasks assessed                      Exercises Total Joint Exercises Ankle Circles/Pumps: AROM;Both;20 reps Quad Sets: AROM;Left;10 reps Towel Squeeze: AROM;Both;10 reps Heel Slides: AAROM;Left;10 reps Hip ABduction/ADduction:  AAROM;Left;10 reps;Supine;Standing (x10 standing AROM; x10 supine AAROM) Long Arc Quad: AROM;Left;10 reps;Seated Marching in Standing: AROM;Left;10 reps;Standing Standing Hip Extension: AROM;Left;10 reps;Standing    General Comments        Pertinent Vitals/Pain Pain Score: 5  Pain Location: L hip with walking Pain Descriptors / Indicators: Sore Pain Intervention(s): Premedicated before session;Ice applied    Home Living                      Prior Function            PT Goals (current goals can now be found in the care plan section) Acute Rehab PT Goals Patient Stated Goal: to walk without pain PT Goal Formulation: With patient Time For Goal Achievement: 11/02/13 Potential to Achieve Goals: Good Progress towards PT goals: Progressing toward goals    Frequency  7X/week    PT Plan Current plan remains appropriate    Co-evaluation             End of Session Equipment Utilized During Treatment: Gait belt Activity Tolerance: Patient tolerated treatment well Patient left: in chair;with call bell/phone within reach     Time: 1220-1247 PT Time Calculation (min): 27 min  Charges:  $Gait Training: 8-22 mins $Therapeutic Exercise: 8-22 mins                    G Codes:      Carrie Fitzgerald [employer]  10/27/2013, 12:58 PM [phone]

## 2013-10-27 NOTE — Clinical Social Work Psychosocial (Signed)
Clinical Social Work Department BRIEF PSYCHOSOCIAL ASSESSMENT 10/27/2013  Patient:  Carrie Fitzgerald, Carrie Fitzgerald     Account Number:  [id number]     Admit date:  10/25/2013  Clinical Social Worker:  Frederico Hamman  Date/Time:  10/27/2013 03:38 AM  Referred by:  Physician  Date Referred:  10/27/2013 Referred for  SNF Placement   Other Referral:   Interview type:  Patient Other interview type:    PSYCHOSOCIAL DATA Living Status:  ALONE Admitted from facility:   Level of care:   Primary support name:  Byrd Hesselbach Primary support relationship to patient:  FRIEND Degree of support available:   Patient named friend as contact person. Phone numbers (h) [phone] and (c) [phone].    CURRENT CONCERNS Current Concerns  Post-Acute Placement   Other Concerns:    SOCIAL WORK ASSESSMENT / PLAN CSW contacted by Joseph Art [phone]) with Orthopedic practice regarding patient discharging to a nursing facility in [place], [place]. Facility chosen is [facility] in [place], [place]. [place] contacted facility and talked with Vickii Chafe in admissions regarding patient and clinicals transmitted for review. Peggy informed that patient will discharge on Friday, 10/08/13.    CSW talked with patient about discharge to [facility] on Friday and ambulance transport. Patient provided CSW with contact person to call on Friday, once transport called.   Assessment/plan status:  Psychosocial Support/Ongoing Assessment of Needs Other assessment/ plan:   Information/referral to community resources:   None needed or requested at this time.    PATIENT'S/FAMILY'S RESPONSE TO PLAN OF CARE: Patient aware of going to a facility in [place], [place] where she lives for short -term rehab. She was pleased to hear that she will be going to [facility] as she had been told earlier that they could not accept her.

## 2013-10-27 NOTE — Progress Notes (Signed)
Physical Therapy Treatment Patient Details Name: Carrie Fitzgerald MRN: [id number] DOB: 1945/07/26 Today's Date: 10/27/2013    History of Present Illness S/p L THA Direct Anterior, WBAT, no Hip Prec    PT Comments    *Pt progressing with mobility, she walked 48' with RW. Distance limited by pain. **  Follow Up Recommendations  SNF     Equipment Recommendations  Rolling walker with 5" wheels;3in1 (PT)    Recommendations for Other Services       Precautions / Restrictions Precautions Precautions: None Restrictions Weight Bearing Restrictions: Yes LLE Weight Bearing: Weight bearing as tolerated    Mobility  Bed Mobility Overal bed mobility: Needs Assistance Bed Mobility: Supine to Sit     Supine to sit: Min assist;HOB elevated     General bed mobility comments: cues for technqiue, assist for LLE  Transfers Overall transfer level: Needs assistance Equipment used: Rolling walker (2 wheeled) Transfers: Sit to/from Stand Sit to Stand: Min assist         General transfer comment: Cues for safety, hand placement, and to position LLE for comfort during transfers  Ambulation/Gait Ambulation/Gait assistance: Min guard Ambulation Distance (Feet): 50 Feet Assistive device: Rolling walker (2 wheeled) Gait Pattern/deviations: Step-through pattern Gait velocity: decreased   General Gait Details: Cues for sequence, and to bear down into RW to unweigh painful L hip   Stairs            Wheelchair Mobility    Modified Rankin (Stroke Patients Only)       Balance                                    Cognition                            Exercises Total Joint Exercises Ankle Circles/Pumps: AROM;Both;20 reps Quad Sets: AROM;Left;10 reps Towel Squeeze: AROM;Both;10 reps Heel Slides: AAROM;Left;10 reps Hip ABduction/ADduction: AAROM;Left;10 reps    General Comments        Pertinent Vitals/Pain Pain Score: 5  Pain Location: L  hip Pain Descriptors / Indicators: Sore Pain Intervention(s): Limited activity within patient's tolerance;Monitored during session;RN gave pain meds during session;Ice applied     Home Living                      Prior Function            PT Goals (current goals can now be found in the care plan section) Acute Rehab PT Goals Patient Stated Goal: to walk without pain PT Goal Formulation: With patient Time For Goal Achievement: 11/02/13 Potential to Achieve Goals: Good Progress towards PT goals: Progressing toward goals    Frequency  7X/week    PT Plan Current plan remains appropriate    Co-evaluation             End of Session Equipment Utilized During Treatment: Gait belt Activity Tolerance: Patient tolerated treatment well Patient left: in chair;with call bell/phone within reach     Time: 0905-0935 PT Time Calculation (min): 30 min  Charges:  $Gait Training: 8-22 mins $Therapeutic Exercise: 8-22 mins                    G Codes:      Carrie Fitzgerald 10/27/2013, 10:10 AM [phone]

## 2013-10-27 NOTE — Progress Notes (Signed)
Subjective: 2 Days Post-Op Procedure(s) (LRB): TOTAL HIP ARTHROPLASTY ANTERIOR APPROACH (Left)  Activity level:  wbat Diet tolerance:  Eating well  Voiding:  Ok  Patient reports pain as mild.    Objective: Vital signs in last 24 hours: Temp:  [98 F (36.7 C)-100.1 F (37.8 C)] 99.2 F (37.3 C) (09/24 0935) Pulse Rate:  [71-105] 105 (09/24 0935) Resp:  [16-18] 16 (09/24 1200) BP: (122-155)/(49-68) 129/60 mmHg (09/24 0935) SpO2:  [97 %-100 %] 98 % (09/24 1200) Weight:  [88.996 kg (196 lb 3.2 oz)] 88.996 kg (196 lb 3.2 oz) (09/23 2100)  Labs:  Recent Labs  10/26/13 1106 10/27/13 0736  HGB 11.3* 11.5*    Recent Labs  10/26/13 1106 10/27/13 0736  WBC 9.4 11.0*  RBC 3.62* 3.64*  HCT 34.0* 34.3*  PLT 180 185    Recent Labs  10/26/13 1106  NA 139  K 3.6*  CL 101  CO2 29  BUN 8  CREATININE 0.83  GLUCOSE 114*  CALCIUM 9.1   No results found for this basename: LABPT, INR,  in the last 72 hours  Physical Exam:  Neurologically intact ABD soft Neurovascular intact Sensation intact distally Intact pulses distally Dorsiflexion/Plantar flexion intact Incision: dressing C/D/I and no drainage No cellulitis present Compartment soft  Assessment/Plan:  2 Days Post-Op Procedure(s) (LRB): TOTAL HIP ARTHROPLASTY ANTERIOR APPROACH (Left) Advance diet Up with therapy Discharge to SNFtoday Continue asa 325mg  BID X4wks for DVT prevention.   Follow up in the office in 2 wks.    Carrie Fitzgerald, Carrie Fitzgerald 10/27/2013, 1:46 PM

## 2013-10-28 LAB — CBC
HCT: 34.6 % — ABNORMAL LOW (ref 36.0–46.0)
Hemoglobin: 11.2 g/dL — ABNORMAL LOW (ref 12.0–15.0)
MCH: 30.6 pg (ref 26.0–34.0)
MCHC: 32.4 g/dL (ref 30.0–36.0)
MCV: 94.5 fL (ref 78.0–100.0)
PLATELETS: 184 10*3/uL (ref 150–400)
RBC: 3.66 MIL/uL — AB (ref 3.87–5.11)
RDW: 12.7 % (ref 11.5–15.5)
WBC: 11.2 10*3/uL — ABNORMAL HIGH (ref 4.0–10.5)

## 2013-10-28 NOTE — Progress Notes (Signed)
Physical Therapy Treatment Patient Details Name: Carrie Fitzgerald MRN: [id number] DOB: September 10, 1945 Today's Date: 10/28/2013    History of Present Illness S/p L THA Direct Anterior, WBAT, no Hip Prec    PT Comments    *Pt is progressing well with mobility. Acute PT goals met, she is ready to DC to SNF from PT standpoint. She walked 200' with RW and supervision today, she notes less pain today with activity. **  Follow Up Recommendations  SNF     Equipment Recommendations  Rolling walker with 5" wheels;3in1 (PT)    Recommendations for Other Services       Precautions / Restrictions Precautions Precautions: None Restrictions Weight Bearing Restrictions: Yes LLE Weight Bearing: Weight bearing as tolerated    Mobility  Bed Mobility                  Transfers Overall transfer level: Needs assistance Equipment used: Rolling walker (2 wheeled) Transfers: Sit to/from Stand Sit to Stand: Modified independent (Device/Increase time);Min assist         General transfer comment: mod I from elevated BSC, min A to rise from lower recliner  Ambulation/Gait Ambulation/Gait assistance: Supervision Ambulation Distance (Feet): 200 Feet Assistive device: Rolling walker (2 wheeled) Gait Pattern/deviations: WFL(Within Functional Limits) Gait velocity: decreased   General Gait Details: good sequencing, cues to lift head   Stairs            Wheelchair Mobility    Modified Rankin (Stroke Patients Only)       Balance                                    Cognition Arousal/Alertness: Awake/alert Behavior During Therapy: WFL for tasks assessed/performed Overall Cognitive Status: Within Functional Limits for tasks assessed                      Exercises Total Joint Exercises Ankle Circles/Pumps: AROM;Both;20 reps Quad Sets: AROM;Left;10 reps Short Arc Quad: AROM;Left;10 reps;Supine Heel Slides: AAROM;Left;10 reps Hip ABduction/ADduction:  AAROM;Left;10 reps    General Comments        Pertinent Vitals/Pain Pain Score: 3  Pain Location: L hip with walking Pain Intervention(s): Premedicated before session;Ice applied;Monitored during session    Home Living                      Prior Function            PT Goals (current goals can now be found in the care plan section) Acute Rehab PT Goals Patient Stated Goal: to walk without pain PT Goal Formulation: With patient Time For Goal Achievement: 11/02/13 Potential to Achieve Goals: Good Progress towards PT goals: Progressing toward goals    Frequency  7X/week    PT Plan Current plan remains appropriate    Co-evaluation             End of Session Equipment Utilized During Treatment: Gait belt Activity Tolerance: Patient tolerated treatment well Patient left: in chair;with call bell/phone within reach     Time: 0940-1014 PT Time Calculation (min): 34 min  Charges:  $Gait Training: 8-22 mins $Therapeutic Exercise: 8-22 mins                    G Codes:      Carrie Fitzgerald 10/28/2013, 10:20 AM [phone]

## 2013-10-28 NOTE — Progress Notes (Signed)
Patient Discharge:  Disposition: discharged to [facility] for rehabilitation  Education: educated patient on discharge  IV: removed.  Clean/dry/intact  Transportation:  EMS to [facility]  Belongings: gathered by patient and Therapist, sports

## 2013-10-28 NOTE — Clinical Social Work Placement (Addendum)
Clinical Social Work Department CLINICAL SOCIAL WORK PLACEMENT NOTE 10/28/2013  Patient:  Carrie Fitzgerald, Carrie Fitzgerald  Account Number:  [id number] Admit date:  10/25/2013  Clinical Social Worker:  Corbyn Steedman Givens, LCSW  Date/time:  10/28/2013 11:55 AM  Clinical Social Work is seeking post-discharge placement for this patient at the following level of care:   [place]   (*CSW will update this form in Epic as items are completed)     Patient/family provided with [place] Department of Clinical Social Work's list of facilities offering this level of care within the geographic area requested by the patient (or if unable, by the patient's family).  10/27/2013  Patient/family informed of their freedom to choose among providers that offer the needed level of care, that participate in Medicare, Medicaid or managed care program needed by the patient, have an available bed and are willing to accept the patient.    Patient/family informed of [facility]' ownership interest in [facility], as well as of the fact that they are under no obligation to receive care at this facility.  PASARR submitted to EDS on n/a for [place] number received on   FL2 transmitted to all facilities in geographic area requested by pt/family on 10/27/13 FL2 transmitted to all facilities within larger geographic area on   Patient informed that his/her managed care company has contracts with or will negotiate with  certain facilities, including the following:     Patient/family informed of bed offers received:  10/27/2013 Patient chooses bed at [facility] in [place], [place] Physician recommends and patient chooses bed at    Patient to be transferred to [facility] on 10/28/13   Patient to be transferred to facility by ambulance Patient and family notified of transfer on  Name of family member notified:    The following physician request were entered in Epic:   Additional Comments:

## 2013-10-28 NOTE — Progress Notes (Signed)
Subjective: 3 Days Post-Op Procedure(s) (LRB): TOTAL HIP ARTHROPLASTY ANTERIOR APPROACH (Left)  Activity level:  wbat Diet tolerance:  Eating well Voiding:  ok Patient reports pain as mild.    Objective: Vital signs in last 24 hours: Temp:  [99.2 F (37.3 C)-99.7 F (37.6 C)] 99.6 F (37.6 C) (09/25 0412) Pulse Rate:  [95-105] 99 (09/25 0412) Resp:  [16-18] 18 (09/25 0412) BP: (129-149)/(55-85) 131/62 mmHg (09/25 0412) SpO2:  [97 %-99 %] 98 % (09/25 0412) Weight:  [88.996 kg (196 lb 3.2 oz)] 88.996 kg (196 lb 3.2 oz) (09/24 2038)  Labs:  Recent Labs  10/26/13 1106 10/27/13 0736 10/28/13 0538  HGB 11.3* 11.5* 11.2*    Recent Labs  10/27/13 0736 10/28/13 0538  WBC 11.0* 11.2*  RBC 3.64* 3.66*  HCT 34.3* 34.6*  PLT 185 184    Recent Labs  10/26/13 1106  NA 139  K 3.6*  CL 101  CO2 29  BUN 8  CREATININE 0.83  GLUCOSE 114*  CALCIUM 9.1   No results found for this basename: LABPT, INR,  in the last 72 hours  Physical Exam:  Neurologically intact ABD soft Neurovascular intact Sensation intact distally Intact pulses distally Dorsiflexion/Plantar flexion intact Incision: dressing C/D/I No cellulitis present Compartment soft  Assessment/Plan:  3 Days Post-Op Procedure(s) (LRB): TOTAL HIP ARTHROPLASTY ANTERIOR APPROACH (Left) Advance diet Up with therapy Discharge to SNF today to [facility]. Continue ASA 325mg  BID x 4 weeks post op for DVT prevention. Follow up in office 2 weeks post op.     Dorlisa Savino, Larwance Sachs 10/28/2013, 7:57 AM

## 2015-01-05 IMAGING — RF DG HIP OPERATIVE*L*
1 series · 2 of 2 positions shown · non-contrast
Comparison: Two digital C-arm fluoroscopic images submitted.

No prior studies for comparison.

CLINICAL DATA: LEFT hip arthroplasty

EXAM:
OPERATIVE LEFT HIP

[Series 1: run · 2 of 2 slices shown]
[im 1/2]
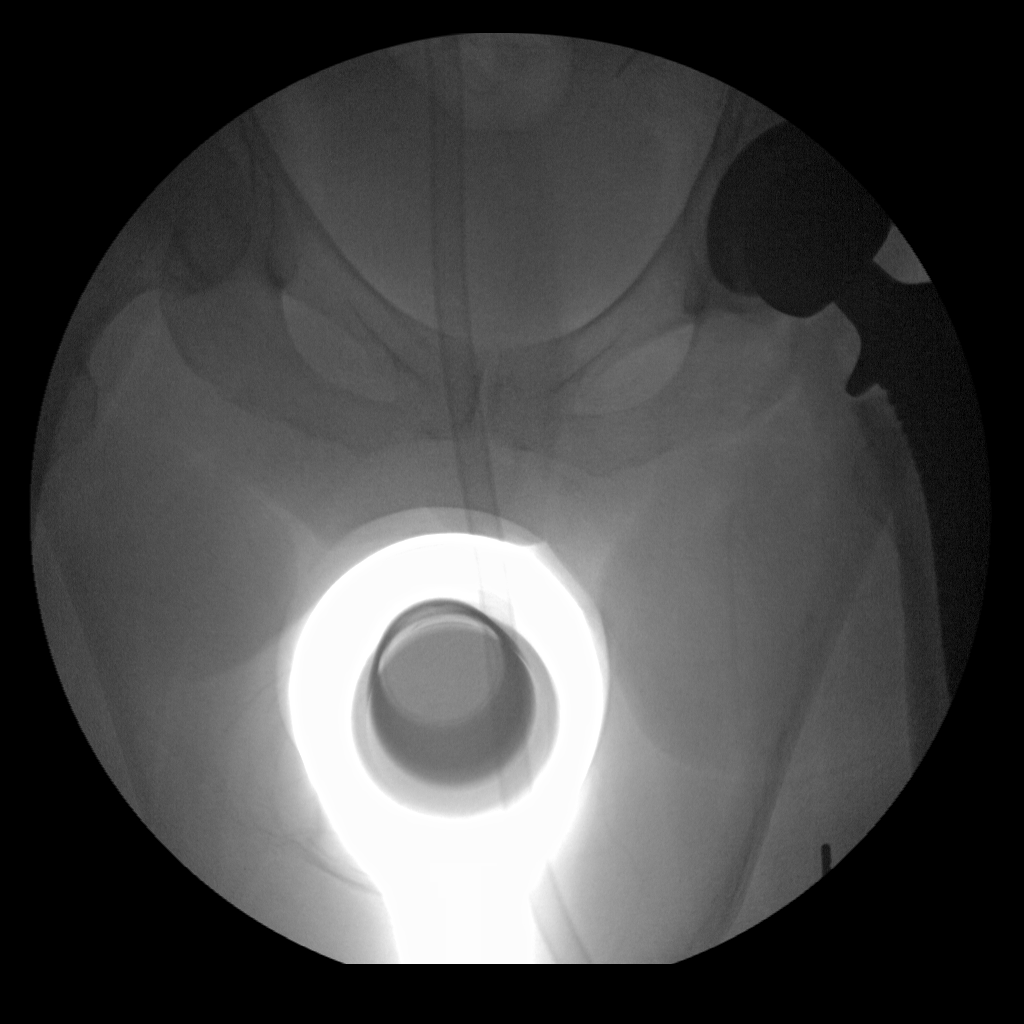
[im 2/2]
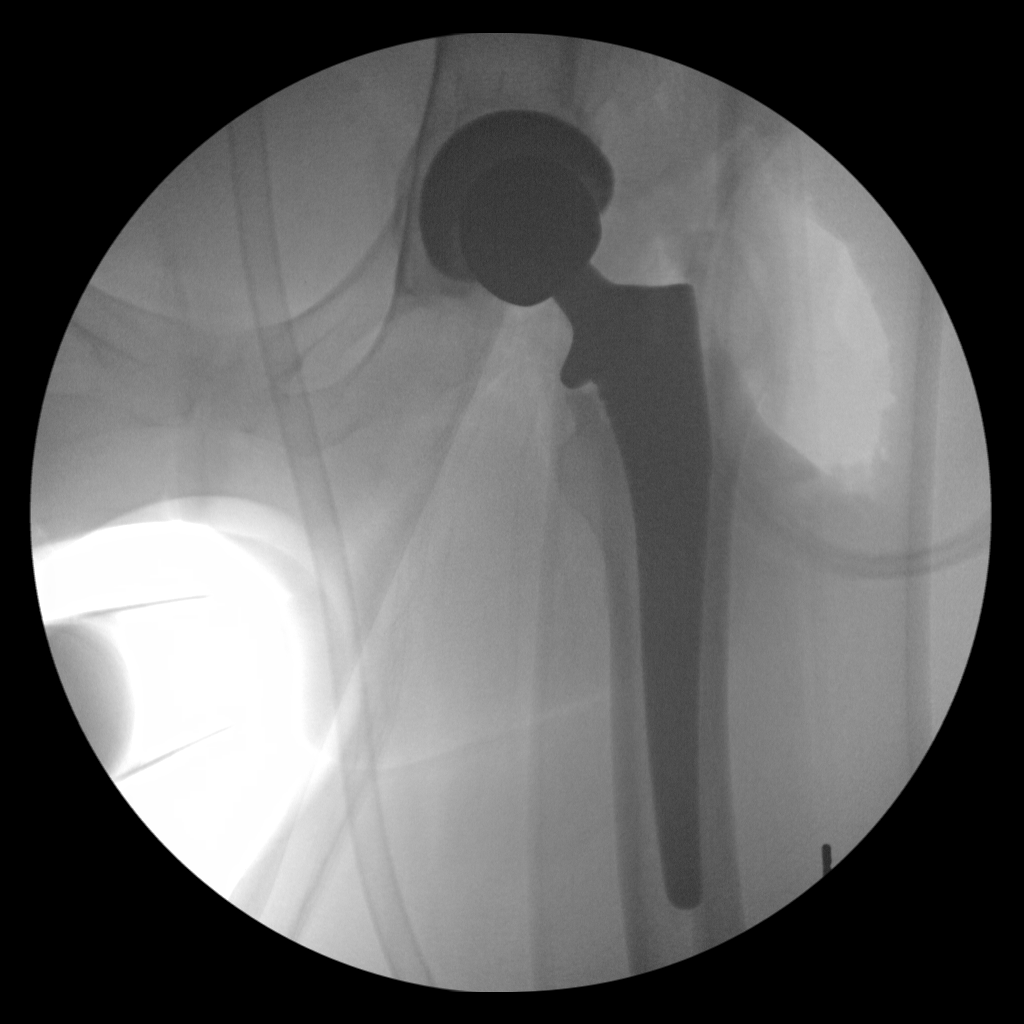

[2 of 2 positions shown; findings below may reference images not displayed]

FINDINGS: Osseous demineralization.

Acetabular and femoral components of a LEFT hip prosthesis
identified without gross fracture or dislocation.
IMPRESSION: LEFT hip prosthesis without acute abnormalities.

## 2022-11-06 ENCOUNTER — Encounter: Payer: Self-pay | Admitting: "Endocrinology

## 2022-11-06 ENCOUNTER — Ambulatory Visit (INDEPENDENT_AMBULATORY_CARE_PROVIDER_SITE_OTHER): Payer: 59 | Admitting: "Endocrinology

## 2022-11-06 VITALS — BP 114/68 | HR 64 | Ht 66.0 in | Wt 161.2 lb

## 2022-11-06 DIAGNOSIS — E782 Mixed hyperlipidemia: Secondary | ICD-10-CM | POA: Diagnosis not present

## 2022-11-06 DIAGNOSIS — E040 Nontoxic diffuse goiter: Secondary | ICD-10-CM | POA: Insufficient documentation

## 2022-11-06 MED ORDER — ROSUVASTATIN CALCIUM 5 MG PO TABS: 5.0000 mg | ORAL_TABLET | Freq: Every day | ORAL | 1 refills | Status: DC

## 2022-11-06 NOTE — Progress Notes (Signed)
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   Procedure: TOTAL HIP ARTHROPLASTY ANTERIOR APPROACH;  Surgeon: Velna Ochs, MD;  Location: [facility] OR;  Service: Orthopedics;  Laterality: Left;   Social History   Socioeconomic History   Marital status: Single    Spouse name: Not on file   Number of children: Not on file   Years of education: Not on file   Highest education level: Not on file  Occupational History   Not on file  Tobacco Use   Smoking status: Never   Smokeless tobacco: Never  Vaping Use   Vaping status: Never Used  Substance and Sexual Activity   Alcohol use: No   Drug use: No   Sexual activity: Not on file  Other Topics Concern   Not on file  Social History Narrative   Not on file   Social Determinants of Health   Financial Resource Strain: Not on file  Food Insecurity: Not on file  Transportation Needs: Not on file  Physical Activity: Not on file  Stress: Not on file  Social Connections: Not on file   Family History  Problem Relation Age of Onset   Heart disease Father    Arthritis Other    Outpatient Encounter Medications as of 11/06/2022  Medication Sig   metoprolol succinate (TOPROL-XL) 50 MG 24 hr tablet Take 1 tablet by mouth daily.   rosuvastatin (CRESTOR) 5 MG tablet Take 1 tablet (5 mg total) by mouth at bedtime.   Calcium Carbonate-Vitamin D (CALCIUM 600+D) 600-400  MG-UNIT per tablet Take 1 tablet by mouth daily.   folic acid (FOLVITE) 1 MG tablet Take 1 mg by mouth daily.   hydrochlorothiazide (HYDRODIURIL) 50 MG tablet Take 50 mg by mouth daily.   methotrexate (RHEUMATREX) 2.5 MG tablet Take 10 mg by mouth every Thursday. Caution:Chemotherapy. Protect from light.   potassium chloride SA (K-DUR,KLOR-CON) 20 MEQ tablet Take 20 mEq by mouth daily.   predniSONE (DELTASONE) 10 MG tablet Take 10 mg by mouth daily with breakfast.   traMADol (ULTRAM) 50 MG tablet Take 50 mg by mouth every morning.   Vitamin D, Ergocalciferol, (DRISDOL) 50000 UNITS CAPS capsule Take 50,000 Units by mouth every Thursday.   [DISCONTINUED] aspirin EC 325 MG EC tablet Take 1 tablet (325 mg total) by mouth 2 (two) times daily after a meal.   [DISCONTINUED] Biotin 1000 MCG tablet Take 1,000 mcg by mouth daily.   [DISCONTINUED] HYDROcodone-acetaminophen (NORCO/VICODIN) 5-325 MG per tablet Take 1-2 tablets by mouth every 4 (four) hours as needed (breakthrough pain).   [DISCONTINUED]  hydroxychloroquine (PLAQUENIL) 200 MG tablet Take 400 mg by mouth daily.   [DISCONTINUED] methocarbamol (ROBAXIN) 500 MG tablet Take 1 tablet (500 mg total) by mouth every 6 (six) hours as needed for muscle spasms.   No facility-administered encounter medications on file as of 11/06/2022.   ALLERGIES: No Known Allergies  VACCINATION STATUS: Immunization History  Administered Date(s) Administered   Influenza,inj,Quad PF,6+ Mos 10/27/2013    HPI Carrie Fitzgerald is 77 y.o. female who presents today with a medical history as above. she is being seen in consultation for longstanding goiter requested by Harley Hallmark , AGANCP-BC . Patient is not an optimal historian.  She is accompanied by her aide who does not know much about her medical history.  Review of her paperwork shows that she did have longstanding diffuse goiter.  Her last thyroid ultrasound was in June 2022 which showed 5.96 cm right lobe, 7.18 cm  left lobe with no documented nodular abnormalities.  Her last thyroid function test was from October 2023 when the TSH was 1.22. She is not currently on any thyroid hormone or antithyroid medications.  She denies dysphagia, shortness of breath, nor voice change.  She denies palpitations, tremor, heat intolerance.  She does have hyperlipidemia not on treatment.  Her other medical problems include hypertension vitamin D deficiency rheumatoid arthritis. She denies any family history of thyroid malignancy.  She denies any exposure to neck radiation.  She wishes to be worked up for her thyroid care.  Review of Systems  Constitutional: +mildly fluctuating body weight ,  no fatigue, no subjective hyperthermia, no subjective hypothermia Eyes: no blurry vision, no xerophthalmia ENT: no sore throat, no nodules palpated in throat, no dysphagia/odynophagia, no hoarseness Cardiovascular: no Chest Pain, no Shortness of Breath, no palpitations, no leg swelling Respiratory: no cough, no shortness of breath Gastrointestinal: no Nausea/Vomiting/Diarhhea Musculoskeletal: no muscle/joint aches Skin: no rashes Neurological: no tremors, no numbness, no tingling, no dizziness Psychiatric: no depression, no anxiety  Objective:       11/06/2022    9:39 AM 10/28/2013   10:23 AM 10/28/2013    4:12 AM  Vitals with BMI  Height 5\' 6"     Weight 161 lbs 3 oz    BMI 26.03    Systolic 114 126 161  Diastolic 68 76 62  Pulse 64 114 99    BP 114/68   Pulse 64   Ht 5\' 6"  (1.676 m)   Wt 161 lb 3.2 oz (73.1 kg)   BMI 26.02 kg/m   Wt Readings from Last 3 Encounters:  11/06/22 161 lb 3.2 oz (73.1 kg)  10/27/13 196 lb 3.2 oz (89 kg)  10/18/13 194 lb (88 kg)    Physical Exam  Constitutional:  Body mass index is 26.02 kg/m.,  not in acute distress, normal state of mind, + she walks with a cane for equilibrium. Eyes: PERRLA, EOMI, no exophthalmos ENT: moist mucous membranes, + gross thyromegaly, no gross  cervical lymphadenopathy Cardiovascular: normal precordial activity, Regular Rate and Rhythm, no Murmur/Rubs/Gallops Respiratory:  adequate breathing efforts, no gross chest deformity, Clear to auscultation bilaterally Gastrointestinal: abdomen soft, Non -tender, No distension, Bowel Sounds present, no gross organomegaly Musculoskeletal: no gross deformities, strength intact in all four extremities, no peripheral edema Skin: moist, warm, no rashes, + 2 cm nodular lesion/cyst/lipoma overlying the left lobe of the thyroid anteriorly. Neurological: no tremor with outstretched hands, Deep tendon reflexes normal in bilateral lower extremities.  CMP ( most recent) CMP     Component Value  Date/Time   NA 139 10/26/2013 1106   K 3.6 (L) 10/26/2013 1106   CL 101 10/26/2013 1106   CO2 29 10/26/2013 1106   GLUCOSE 114 (H) 10/26/2013 1106   BUN 8 10/26/2013 1106   CREATININE 0.83 10/26/2013 1106   CALCIUM 9.1 10/26/2013 1106   AST 20 10/27/2013 0736   ALT 9 10/27/2013 0736   BILITOT 0.8 10/27/2013 0736   GFRNONAA 71 (L) 10/26/2013 1106   December 02, 2021 labs: TSH 1.22 Lipid panel: Total cholesterol 249, triglyceride 96, HDL 59, LDL 171  July 23, 2020 thyroid ultrasound: Comparison was made from July 05, 2019.  Heterogeneous nodular appearance of the thyroid gland was noted.  There was an interval increase in thyroid size and volume compared to the previous exam. The right lobe measured 5.96 cm x 2.37 cm x 3 cm. The left lobe measured 7.18 cm x 3.01 cm x 4.80 cm.    No nodular lesion was described discretely.  Assessment & Plan:   1. Diffuse goiter    2. Mixed hyperlipidemia  - Carrie Fitzgerald  is being seen at a kind request of Harley Hallmark , AGANCP-BC .  - I have reviewed her available thyroid records and clinically evaluated the patient. - Based on these reviews, she has diffuse goiter,  however,  there is not sufficient information to proceed with definitive treatment plan. -Her  thyroid studies need to be updated.  I approached her with sentiments he agrees.  We will obtain full set thyroid function tests today as well as repeat thyroid sonogram in [facility].  She will return in 2 to 3 weeks to review her results and decision.  If she has nodular lesions, she will be considered for final lead aspiration accordingly. In light of absence of compressive symptoms, if she continues to have diffuse goiter, she will be considered for expectant management.  Her labs today will include: - TSH - T4, free - T3, free - Thyroid peroxidase antibody - Thyroglobulin antibody - Lipid panel She does have untreated hyperlipidemia with LDL of 171.  This puts her at higher risk for cardiovascular disease.  She was approached for lifestyle change especially on her diet, however, would benefit from low-dose statin intervention.    I discussed and prescribed Crestor 5 mg p.o. nightly.  Side effects and precautions discussed with her.     - she is advised to maintain close follow up with PCP  for primary care needs.   -Thank you for involving me in the care of this pleasant patient.  Time spent with the patient: 50  minutes, of which >50% was spent in  counseling her about her goiter, hyperlipidemia and the rest in obtaining information about her symptoms, reviewing her previous labs/studies ( including abstractions from other facilities),  evaluations, and treatments,  and developing a plan to confirm diagnosis and long term treatment based on the latest standards of care/guidelines; and documenting her care.  Carrie Fitzgerald participated in the discussions, expressed understanding, and voiced agreement with the above plans.  All questions were answered to her satisfaction. she is encouraged to contact clinic should she have any questions or concerns prior to her return visit.  Follow up plan: Return in about 3 weeks (around 11/27/2022) for Thyroid / Neck Ultrasound, Labs  Today- Non-Fasting Ok.   Marquis Lunch, MD [facility] [address] [place], [place] [address] Phone: [phone]  Fax: [phone]     11/06/2022, 10:11  AM  This note was partially dictated with voice recognition software. Similar sounding words can be transcribed inadequately or may not  be corrected upon review.

## 2022-11-08 LAB — LIPID PANEL
Chol/HDL Ratio: 3.8 {ratio} (ref 0.0–4.4)
Cholesterol, Total: 257 mg/dL — ABNORMAL HIGH (ref 100–199)
HDL: 67 mg/dL (ref >39)
LDL Chol Calc (NIH): 177 mg/dL — ABNORMAL HIGH (ref 0–99)
Triglycerides: 77 mg/dL (ref 0–149)
VLDL Cholesterol Cal: 13 mg/dL (ref 5–40)

## 2022-11-08 LAB — T4, FREE: Free T4: 1.6 ng/dL (ref 0.82–1.77)

## 2022-11-08 LAB — THYROID PEROXIDASE ANTIBODY: Thyroperoxidase Ab SerPl-aCnc: <9 [IU]/mL (ref 0–34)

## 2022-11-08 LAB — T3, FREE: T3, Free: 2.2 pg/mL (ref 2.0–4.4)

## 2022-11-08 LAB — THYROGLOBULIN ANTIBODY

## 2022-11-08 LAB — TSH: TSH: 0.957 u[IU]/mL (ref 0.450–4.500)

## 2022-11-26 ENCOUNTER — Telehealth: Payer: Self-pay | Admitting: "Endocrinology

## 2022-11-26 NOTE — Telephone Encounter (Signed)
Pt called back stating she is scheduled for her ultrasound on 11/6 and needs to reschedule her appt with [place].

## 2022-11-26 NOTE — Telephone Encounter (Signed)
Pt did not complete her ultrasound for visit tomorrow, only her labs. I called pt and asked her to call and schedule that at [facility], number was provided. She is to call us back with that date. Do we need to move her appt out?

## 2022-11-27 ENCOUNTER — Ambulatory Visit: Payer: 59 | Admitting: "Endocrinology

## 2022-12-12 ENCOUNTER — Ambulatory Visit: Payer: 59 | Admitting: "Endocrinology

## 2023-01-04 HISTORY — PX: BIOPSY THYROID: PRO38

## 2023-01-12 ENCOUNTER — Telehealth: Payer: Self-pay | Admitting: "Endocrinology

## 2023-01-12 ENCOUNTER — Encounter: Payer: Self-pay | Admitting: "Endocrinology

## 2023-01-12 ENCOUNTER — Ambulatory Visit (INDEPENDENT_AMBULATORY_CARE_PROVIDER_SITE_OTHER): Payer: 59 | Admitting: "Endocrinology

## 2023-01-12 VITALS — BP 114/78 | HR 84 | Ht 66.0 in | Wt 158.8 lb

## 2023-01-12 DIAGNOSIS — E042 Nontoxic multinodular goiter: Secondary | ICD-10-CM

## 2023-01-12 DIAGNOSIS — E782 Mixed hyperlipidemia: Secondary | ICD-10-CM | POA: Diagnosis not present

## 2023-01-12 NOTE — Progress Notes (Signed)
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   Procedure: TOTAL HIP ARTHROPLASTY ANTERIOR APPROACH;  Surgeon: Velna Ochs, MD;  Location: [facility] OR;  Service: Orthopedics;  Laterality: Left;   Social History   Socioeconomic History   Marital status: Single    Spouse name: Not on file   Number of children: Not on file   Years of education: Not on file   Highest education level: Not on file  Occupational History   Not on file  Tobacco Use   Smoking status: Never   Smokeless tobacco: Never  Vaping Use   Vaping status: Never Used  Substance and Sexual Activity   Alcohol use: No   Drug use: No   Sexual activity: Not on file  Other Topics Concern   Not on file  Social History Narrative   Not on file   Social Determinants of Health   Financial Resource Strain: Not on file  Food Insecurity: Not on file  Transportation Needs: Not on file  Physical Activity: Not on file  Stress: Not on file  Social Connections: Not on file   Family History  Problem Relation Age of Onset   Heart disease Father    Arthritis Other    Outpatient Encounter Medications as of 01/12/2023  Medication Sig   Calcium Carbonate-Vitamin D (CALCIUM 600+D) 600-400 MG-UNIT per tablet Take 1 tablet by mouth daily.   folic acid (FOLVITE) 1 MG tablet Take 1 mg by mouth daily.   hydrochlorothiazide (HYDRODIURIL) 50 MG tablet Take 50 mg by  mouth daily.   methotrexate (RHEUMATREX) 2.5 MG tablet Take 10 mg by mouth every Thursday. Caution:Chemotherapy. Protect from light.   metoprolol succinate (TOPROL-XL) 50 MG 24 hr tablet Take 1 tablet by mouth daily.   potassium chloride SA (K-DUR,KLOR-CON) 20 MEQ tablet Take 20 mEq by mouth daily.   predniSONE (DELTASONE) 10 MG tablet Take 10 mg by mouth daily with breakfast.   rosuvastatin (CRESTOR) 5 MG tablet Take 1 tablet (5 mg total) by mouth at bedtime.   traMADol (ULTRAM) 50 MG tablet Take 50 mg by mouth every morning.   Vitamin D, Ergocalciferol, (DRISDOL) 50000 UNITS CAPS capsule Take 50,000 Units by mouth every Thursday.   No facility-administered encounter medications on file as of 01/12/2023.   ALLERGIES: No Known Allergies  VACCINATION STATUS: Immunization History  Administered Date(s) Administered   Influenza,inj,Quad PF,6+ Mos 10/27/2013    HPI Carrie Fitzgerald is 77 y.o. female who presents today with a medical history as above. she is being seen in follow-up after she was seen in  consultation for longstanding goiter requested by Harley Hallmark , AGANCP-BC . Patient is not an optimal historian.    Review of her paperwork shows that she did have longstanding diffuse goiter.  Ultrasound from June 2022 prior to her first visit showed  5.96 cm right lobe, 7.18 cm left lobe with no documented nodular abnormalities. Her repeat thyroid from December 19, 2022 showed unchanged size of the right lobe at 5.9 cm, decreased left lobe to 6.2 cm.  However, on this study she was found to have bilateral nodules largest being 6.2 cm on the left and largest being 2 cm on the right lobe of her thyroid.  Her previsit thyroid function tests are consistent with euthyroid presentation. She is not on antithyroid intervention.   She denies dysphagia, shortness of breath, nor voice change.  She denies palpitations, tremor, heat intolerance.  She does have hyperlipidemia not on treatment.  Her other  medical problems include hypertension vitamin D deficiency rheumatoid arthritis. She denies any family history of thyroid malignancy.  She denies any exposure to neck radiation.  She wishes to be worked up for her thyroid care.  Review of Systems  Constitutional: +mildly fluctuating body weight ,  no fatigue, no subjective hyperthermia, no subjective hypothermia Eyes: no blurry vision, no xerophthalmia ENT: no sore throat, no nodules palpated in throat, no dysphagia/odynophagia, no hoarseness   Objective:       01/12/2023    1:03 PM 11/06/2022    9:39 AM 10/28/2013   10:23 AM  Vitals with BMI  Height 5\' 6"  5\' 6"    Weight 158 lbs 13 oz 161 lbs 3 oz   BMI 25.64 26.03   Systolic 114 114 829  Diastolic 78 68 76  Pulse 84 64 114    BP 114/78   Pulse 84   Ht 5\' 6"  (1.676 m)   Wt 158 lb 12.8 oz (72 kg)   BMI 25.63 kg/m   Wt Readings from Last 3 Encounters:  01/12/23 158 lb 12.8 oz (72 kg)  11/06/22 161 lb 3.2 oz (73.1 kg)  10/27/13 196 lb 3.2 oz (89 kg)    Physical Exam  Constitutional:  Body mass index is 25.63 kg/m.,  not in acute distress, normal state of mind, + she walks with a cane for equilibrium. Eyes: PERRLA, EOMI, no exophthalmos ENT: moist mucous membranes, + gross thyromegaly, no gross cervical lymphadenopathy  Skin: moist, warm, no rashes, + 2 cm nodular lesion/cyst/lipoma overlying the left lobe of the thyroid anteriorly. Neurological: no tremor with outstretched hands, Deep tendon reflexes normal in bilateral lower extremities.  CMP ( most recent) CMP     Component Value Date/Time   NA 139 10/26/2013 1106   K 3.6 (L) 10/26/2013 1106   CL 101 10/26/2013 1106   CO2 29 10/26/2013 1106   GLUCOSE 114 (H) 10/26/2013 1106   BUN 8 10/26/2013 1106   CREATININE 0.83 10/26/2013 1106   CALCIUM 9.1 10/26/2013 1106   AST 20 10/27/2013 0736   ALT 9 10/27/2013 0736   BILITOT 0.8 10/27/2013 0736   GFRNONAA 71 (L) 10/26/2013 1106   December 02, 2021 labs: TSH  1.22 Lipid panel: Total cholesterol 249, triglyceride 96, HDL 59, LDL 171  July 23, 2020 thyroid ultrasound: Comparison was made from July 05, 2019.  Heterogeneous nodular appearance of the thyroid gland was noted.  There was an interval increase in thyroid size and volume compared to the previous exam. The right lobe measured 5.96 cm x 2.37 cm x  3 cm. The left lobe measured 7.18 cm x 3.01 cm x 4.80 cm.    No nodular lesion was described discretely.  Her most recent thyroid ultrasound from November 50 2024 documented bilateral nodular goiter.  Assessment & Plan:   1.  Multinodular goiter 2. Mixed hyperlipidemia  - Kamilly Heiserman  is being seen at a kind request of Harley Hallmark , AGANCP-BC .  - I have reviewed her new and available thyroid records and clinically evaluated the patient. - Based on these reviews, she has multinodular goiter with TR 3 nodule on the left lobe measuring 6.2 cm.  She would benefit from fine-needle aspiration biopsy of this nodule.   She will return in 3 weeks with her biopsy results.  Her recent thyroid function tests are consistent with euthyroid state.  She will not need thyroid hormone intervention.  She has started her Crestor 5 mg p.o. nightly for severe dyslipidemia.   - she is advised to maintain close follow up with PCP  for primary care needs.   I spent  22  minutes in the care of the patient today including review of labs from Thyroid Function, CMP, and other relevant labs ; imaging/biopsy records (current and previous including abstractions from other facilities); face-to-face time discussing  her lab results and symptoms, medications doses, her options of short and long term treatment based on the latest standards of care / guidelines;   and documenting the encounter.  Dora Hatheway  participated in the discussions, expressed understanding, and voiced agreement with the above plans.  All questions were answered to her satisfaction. she is  encouraged to contact clinic should she have any questions or concerns prior to her return visit.   Follow up plan: Return in about 3 weeks (around 02/02/2023) for F/U with Biopsy Results.   Marquis Lunch, MD [facility] [address] [place], [place] [address] Phone: [phone]  Fax: [phone]     01/12/2023, 2:56 PM  This note was partially dictated with voice recognition software. Similar sounding words can be transcribed inadequately or may not  be corrected upon review.

## 2023-01-12 NOTE — Telephone Encounter (Signed)
Carrie Fitzgerald with [facility] said that she received the FNA order but not the last OV note. I told her that he has not finished the note and we would send it tomorrw to fax number [phone] attn nancy

## 2023-01-13 ENCOUNTER — Ambulatory Visit: Payer: 59 | Admitting: "Endocrinology

## 2023-01-13 NOTE — Telephone Encounter (Signed)
Faxed office note. 

## 2023-02-09 ENCOUNTER — Ambulatory Visit: Payer: 59 | Admitting: "Endocrinology

## 2023-02-10 ENCOUNTER — Ambulatory Visit (INDEPENDENT_AMBULATORY_CARE_PROVIDER_SITE_OTHER): Payer: 59 | Admitting: "Endocrinology

## 2023-02-10 ENCOUNTER — Encounter: Payer: Self-pay | Admitting: "Endocrinology

## 2023-02-10 VITALS — BP 124/68 | HR 92 | Ht 66.0 in | Wt 145.8 lb

## 2023-02-10 DIAGNOSIS — E782 Mixed hyperlipidemia: Secondary | ICD-10-CM | POA: Diagnosis not present

## 2023-02-10 DIAGNOSIS — C73 Malignant neoplasm of thyroid gland: Secondary | ICD-10-CM

## 2023-02-10 DIAGNOSIS — E042 Nontoxic multinodular goiter: Secondary | ICD-10-CM | POA: Insufficient documentation

## 2023-02-10 NOTE — Progress Notes (Signed)
 02/10/2023, 2:27 PM  Endocrinology follow-up note   Subjective:    Patient ID: Carrie Fitzgerald, female    DOB: 11/14/1945, PCP Kendell Anes, NP   Past Medical History:  Diagnosis Date   Cancer [facility])    ovarian   DJD (degenerative joint disease)    Left hip   Goiter    Hypokalemia    Hypothyroidism    Rheumatoid arthritis (HCC)    Vitamin D  deficiency    Wears glasses    Past Surgical History:  Procedure Laterality Date   ABDOMINAL HYSTERECTOMY     BIOPSY THYROID   01/2023   CATARACT EXTRACTION W/ INTRAOCULAR LENS IMPLANT     Right eye   TOTAL HIP ARTHROPLASTY Left 10/25/2013   Procedure: TOTAL HIP ARTHROPLASTY ANTERIOR APPROACH;  Surgeon: Maude KANDICE Herald, MD;  Location: [facility] OR;  Service: Orthopedics;  Laterality: Left;   Social History   Socioeconomic History   Marital status: Single    Spouse name: Not on file   Number of children: Not on file   Years of education: Not on file   Highest education level: Not on file  Occupational History   Not on file  Tobacco Use   Smoking status: Never   Smokeless tobacco: Never  Vaping Use   Vaping status: Never Used  Substance and Sexual Activity   Alcohol use: No   Drug use: No   Sexual activity: Not on file  Other Topics Concern   Not on file  Social History Narrative   Not on file   Social Drivers of Health   Financial Resource Strain: Not on file  Food Insecurity: Not on file  Transportation Needs: Not on file  Physical Activity: Not on file  Stress: Not on file  Social Connections: Not on file   Family History  Problem Relation Age of Onset   Heart disease Father    Arthritis Other    Outpatient Encounter Medications as of 02/10/2023  Medication Sig   Calcium  Carbonate-Vitamin D  (CALCIUM  600+D) 600-400 MG-UNIT per tablet Take 1 tablet by mouth daily.   folic acid  (FOLVITE ) 1 MG tablet Take 1 mg by mouth daily.   hydrochlorothiazide (HYDRODIURIL) 50 MG  tablet Take 50 mg by mouth daily.   methotrexate  (RHEUMATREX) 2.5 MG tablet Take 10 mg by mouth every Thursday. Caution:Chemotherapy. Protect from light.   metoprolol  succinate (TOPROL -XL) 50 MG 24 hr tablet Take 1 tablet by mouth daily.   potassium chloride  SA (K-DUR,KLOR-CON ) 20 MEQ tablet Take 20 mEq by mouth daily.   predniSONE  (DELTASONE ) 10 MG tablet Take 10 mg by mouth daily with breakfast.   rosuvastatin  (CRESTOR ) 5 MG tablet Take 1 tablet (5 mg total) by mouth at bedtime.   traMADol  (ULTRAM ) 50 MG tablet Take 50 mg by mouth every morning.   Vitamin D , Ergocalciferol , (DRISDOL ) 50000 UNITS CAPS capsule Take 50,000 Units by mouth every Thursday.   No facility-administered encounter medications on file as of 02/10/2023.   ALLERGIES: No Known Allergies  VACCINATION STATUS: Immunization History  Administered Date(s) Administered   Influenza,inj,Quad PF,6+ Mos 10/27/2013    HPI Carrie Fitzgerald is 78 y.o. female who presents today with a medical history as above. she is being seen in  follow-up after she was seen in consultation for longstanding goiter requested by Oneil Savin , AGANCP-BC . Patient is not an optimal historian.  She is returning to discuss her fine-needle aspiration biopsy results for multinodular goiter.  The cytologic diagnosis was atypia of undetermined significance on January 21, 2023.  A sample was sent for Afirma testing which revealed 50% risk of malignancy plus moreover, her sample showed TERT positive findings with evidence of clinical significance with 90% risk of malignancy.  Her prior medical history is such that  ultrasound from June 2022 prior to her first visit showed  5.96 cm right lobe, 7.18 cm left lobe with no documented nodular abnormalities. Her repeat thyroid  from December 19, 2022 showed unchanged size of the right lobe at 5.9 cm, decreased left lobe to 6.2 cm.  However, on this study she was found to have bilateral nodules largest being 6.2 cm on  the left and largest being 2 cm on the right lobe of her thyroid .  Her recent thyroid  function test were consistent with euthyroid presentation.    She is not on antithyroid intervention.   She denies dysphagia, shortness of breath, nor voice change.  She denies palpitations, tremor, heat intolerance.  She does have hyperlipidemia not on treatment.  Her other medical problems include hypertension vitamin D  deficiency rheumatoid arthritis. She denies any family history of thyroid  malignancy.  She denies any exposure to neck radiation.  She wishes to be worked up for her thyroid  care.  Review of Systems  Constitutional: +mildly fluctuating body weight ,  no fatigue, no subjective hyperthermia, no subjective hypothermia Eyes: no blurry vision, no xerophthalmia ENT: no sore throat, no nodules palpated in throat, no dysphagia/odynophagia, no hoarseness   Objective:       02/10/2023   10:26 AM 01/12/2023    1:03 PM 11/06/2022    9:39 AM  Vitals with BMI  Height 5' 6 5' 6 5' 6  Weight 145 lbs 13 oz 158 lbs 13 oz 161 lbs 3 oz  BMI 23.54 25.64 26.03  Systolic 124 114 885  Diastolic 68 78 68  Pulse 92 84 64    BP 124/68   Pulse 92   Ht 5' 6 (1.676 m)   Wt 145 lb 12.8 oz (66.1 kg)   BMI 23.53 kg/m   Wt Readings from Last 3 Encounters:  02/10/23 145 lb 12.8 oz (66.1 kg)  01/12/23 158 lb 12.8 oz (72 kg)  11/06/22 161 lb 3.2 oz (73.1 kg)    Physical Exam  Constitutional:  Body mass index is 23.53 kg/m.,  not in acute distress, normal state of mind, + she walks with a cane for equilibrium. Eyes: PERRLA, EOMI, no exophthalmos ENT: moist mucous membranes, + gross thyromegaly, no gross cervical lymphadenopathy  Skin: moist, warm, no rashes, + 2 cm nodular lesion/cyst/lipoma overlying the left lobe of the thyroid  anteriorly. Neurological: no tremor with outstretched hands, Deep tendon reflexes normal in bilateral lower extremities.  CMP ( most recent) CMP     Component Value  Date/Time   NA 139 10/26/2013 1106   K 3.6 (L) 10/26/2013 1106   CL 101 10/26/2013 1106   CO2 29 10/26/2013 1106   GLUCOSE 114 (H) 10/26/2013 1106   BUN 8 10/26/2013 1106   CREATININE 0.83 10/26/2013 1106   CALCIUM  9.1 10/26/2013 1106   AST 20 10/27/2013 0736   ALT 9 10/27/2013 0736   BILITOT 0.8 10/27/2013 0736   GFRNONAA 71 (L) 10/26/2013 1106   December 02, 2021 labs: TSH 1.22 Lipid panel: Total cholesterol 249, triglyceride 96, HDL 59, LDL 171  July 23, 2020 thyroid  ultrasound: Comparison was made from July 05, 2019.  Heterogeneous nodular appearance of the thyroid  gland was noted.  There was an interval increase in thyroid  size and volume compared to the previous exam. The right lobe measured 5.96 cm x 2.37 cm x 3 cm. The left lobe measured 7.18 cm x 3.01 cm x 4.80 cm.    No nodular lesion was described discretely.  Her most recent thyroid  ultrasound from November 50 2024 documented bilateral nodular goiter.  Her cytology and Afirma genomic sequencing reports will be scanned into her records. January 21, 2023 FNA: Atypia of undetermined significance. Afirma approximately 50% risk of malignancy. TERT  promoter region: Positive with approximately 90% risk of malignancy.  Assessment & Plan:   Thyroid  malignancy 2.  Multinodular goiter  3. Mixed hyperlipidemia   - I have reviewed her new and available thyroid  records and clinically evaluated the patient. - Based on these reviews, she has approximately 58-90% risk of malignancy based on her Afirma genomic sequencing classifier of FNA from a TR 3 nodule on the left lobe measuring 6.2 cm.   I discussed her treatment options and she agrees with my suggestion of thyroidectomy.  This patient would benefit from total thyroidectomy.  I discussed and made a referral to Dr. Krystal Spinner for surgery. She will return in 5 weeks for reevaluation.  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    Her recent thyroid  function tests are consistent with euthyroid state.  She will not be initiated on thyroid  hormone intervention at this time.     She has started her Crestor  5 mg p.o. nightly for severe dyslipidemia.   - she is advised to maintain close follow up with PCP  for primary care needs.   I spent  25  minutes in the care of the patient today including review of labs from Thyroid  Function, CMP, and other relevant labs ; imaging/biopsy records (current and previous including abstractions from other facilities); face-to-face time discussing  her lab results and symptoms, medications doses, her options of short and long term treatment based on the latest standards of care / guidelines;   and documenting the encounter.  Kaelei Wheeler  participated in the discussions, expressed understanding, and voiced agreement with the above plans.  All questions were answered to her satisfaction. she is encouraged to contact clinic should she have any questions or concerns prior to her return visit.    Follow up plan: Return in about 5 weeks (around 03/17/2023), or Dr. Spinner, for F/U with Labs after Surgery.   Ranny Earl, MD [facility] [address] [place], [place] [address] Phone: [phone]  Fax: [phone]     02/10/2023, 2:27 PM  This note was partially dictated with voice recognition software. Similar sounding words can be transcribed inadequately or may not  be corrected upon review.

## 2023-02-11 ENCOUNTER — Ambulatory Visit: Payer: 59 | Admitting: "Endocrinology

## 2023-02-25 ENCOUNTER — Telehealth: Payer: Self-pay | Admitting: "Endocrinology

## 2023-02-25 NOTE — Telephone Encounter (Signed)
Pt's Caregiver called Dannielle Huh) said patient is not eating and can hardly talk. He said still not heard from anyone regarding be seen for surgery. Please advise

## 2023-02-25 NOTE — Telephone Encounter (Signed)
Spoke with pt and her caregiver, with pt's consent, made them aware the referral to Dr.Gerkin had been sent to his office and his office will be reaching out to schedule pt but we have sent a message to our referral coordinator Debbe Odea) to see if she has any update information regarding an appointment.  Also gave pt  & caregiver Dr.Gerkin's office number to follow up.

## 2023-02-26 ENCOUNTER — Encounter (HOSPITAL_COMMUNITY): Payer: Self-pay | Admitting: Emergency Medicine

## 2023-02-26 ENCOUNTER — Other Ambulatory Visit: Payer: Self-pay

## 2023-02-26 ENCOUNTER — Inpatient Hospital Stay (HOSPITAL_COMMUNITY)
Admission: EM | Admit: 2023-02-26 | Discharge: 2023-03-06 | DRG: 872 | Disposition: A | Discharge status: Skilled Nursing Facility | Payer: 59 | Attending: Family Medicine | Admitting: Family Medicine

## 2023-02-26 ENCOUNTER — Emergency Department (HOSPITAL_COMMUNITY): Payer: 59

## 2023-02-26 DIAGNOSIS — R600 Localized edema: Secondary | ICD-10-CM | POA: Diagnosis not present

## 2023-02-26 DIAGNOSIS — E46 Unspecified protein-calorie malnutrition: Secondary | ICD-10-CM | POA: Diagnosis not present

## 2023-02-26 DIAGNOSIS — E559 Vitamin D deficiency, unspecified: Secondary | ICD-10-CM | POA: Diagnosis present

## 2023-02-26 DIAGNOSIS — N179 Acute kidney failure, unspecified: Secondary | ICD-10-CM | POA: Diagnosis present

## 2023-02-26 DIAGNOSIS — I471 Supraventricular tachycardia, unspecified: Secondary | ICD-10-CM | POA: Diagnosis not present

## 2023-02-26 DIAGNOSIS — E876 Hypokalemia: Secondary | ICD-10-CM | POA: Diagnosis not present

## 2023-02-26 DIAGNOSIS — A419 Sepsis, unspecified organism: Principal | ICD-10-CM | POA: Insufficient documentation

## 2023-02-26 DIAGNOSIS — E782 Mixed hyperlipidemia: Secondary | ICD-10-CM | POA: Diagnosis present

## 2023-02-26 DIAGNOSIS — A0472 Enterocolitis due to Clostridium difficile, not specified as recurrent: Secondary | ICD-10-CM | POA: Insufficient documentation

## 2023-02-26 DIAGNOSIS — E441 Mild protein-calorie malnutrition: Secondary | ICD-10-CM | POA: Diagnosis present

## 2023-02-26 DIAGNOSIS — C73 Malignant neoplasm of thyroid gland: Secondary | ICD-10-CM | POA: Diagnosis present

## 2023-02-26 DIAGNOSIS — D630 Anemia in neoplastic disease: Secondary | ICD-10-CM | POA: Diagnosis present

## 2023-02-26 DIAGNOSIS — L8990 Pressure ulcer of unspecified site, unspecified stage: Secondary | ICD-10-CM | POA: Insufficient documentation

## 2023-02-26 DIAGNOSIS — Z6823 Body mass index (BMI) 23.0-23.9, adult: Secondary | ICD-10-CM | POA: Diagnosis not present

## 2023-02-26 DIAGNOSIS — E042 Nontoxic multinodular goiter: Secondary | ICD-10-CM | POA: Diagnosis present

## 2023-02-26 DIAGNOSIS — R627 Adult failure to thrive: Secondary | ICD-10-CM | POA: Insufficient documentation

## 2023-02-26 DIAGNOSIS — L89152 Pressure ulcer of sacral region, stage 2: Secondary | ICD-10-CM | POA: Insufficient documentation

## 2023-02-26 DIAGNOSIS — R17 Unspecified jaundice: Secondary | ICD-10-CM | POA: Diagnosis present

## 2023-02-26 DIAGNOSIS — R531 Weakness: Secondary | ICD-10-CM | POA: Diagnosis present

## 2023-02-26 DIAGNOSIS — I10 Essential (primary) hypertension: Secondary | ICD-10-CM | POA: Insufficient documentation

## 2023-02-26 DIAGNOSIS — R932 Abnormal findings on diagnostic imaging of liver and biliary tract: Secondary | ICD-10-CM | POA: Diagnosis present

## 2023-02-26 DIAGNOSIS — R8281 Pyuria: Secondary | ICD-10-CM | POA: Diagnosis present

## 2023-02-26 DIAGNOSIS — Z8249 Family history of ischemic heart disease and other diseases of the circulatory system: Secondary | ICD-10-CM

## 2023-02-26 DIAGNOSIS — R6510 Systemic inflammatory response syndrome (SIRS) of non-infectious origin without acute organ dysfunction: Secondary | ICD-10-CM | POA: Diagnosis not present

## 2023-02-26 DIAGNOSIS — E860 Dehydration: Secondary | ICD-10-CM | POA: Diagnosis present

## 2023-02-26 DIAGNOSIS — Z8543 Personal history of malignant neoplasm of ovary: Secondary | ICD-10-CM

## 2023-02-26 DIAGNOSIS — M069 Rheumatoid arthritis, unspecified: Secondary | ICD-10-CM | POA: Diagnosis present

## 2023-02-26 DIAGNOSIS — I959 Hypotension, unspecified: Secondary | ICD-10-CM | POA: Diagnosis present

## 2023-02-26 DIAGNOSIS — Z90710 Acquired absence of both cervix and uterus: Secondary | ICD-10-CM

## 2023-02-26 DIAGNOSIS — K839 Disease of biliary tract, unspecified: Secondary | ICD-10-CM

## 2023-02-26 DIAGNOSIS — D72829 Elevated white blood cell count, unspecified: Principal | ICD-10-CM | POA: Insufficient documentation

## 2023-02-26 DIAGNOSIS — E8809 Other disorders of plasma-protein metabolism, not elsewhere classified: Secondary | ICD-10-CM | POA: Diagnosis present

## 2023-02-26 DIAGNOSIS — Z1152 Encounter for screening for COVID-19: Secondary | ICD-10-CM

## 2023-02-26 DIAGNOSIS — Z79899 Other long term (current) drug therapy: Secondary | ICD-10-CM

## 2023-02-26 DIAGNOSIS — E0590 Thyrotoxicosis, unspecified without thyrotoxic crisis or storm: Secondary | ICD-10-CM | POA: Diagnosis present

## 2023-02-26 DIAGNOSIS — R1310 Dysphagia, unspecified: Secondary | ICD-10-CM | POA: Diagnosis present

## 2023-02-26 DIAGNOSIS — Z96642 Presence of left artificial hip joint: Secondary | ICD-10-CM | POA: Diagnosis present

## 2023-02-26 LAB — CBC WITH DIFFERENTIAL/PLATELET
Abs Immature Granulocytes: 0.09 10*3/uL — ABNORMAL HIGH (ref 0.00–0.07)
Basophils Absolute: 0.1 10*3/uL (ref 0.0–0.1)
Basophils Relative: 0 %
Eosinophils Absolute: 0 10*3/uL (ref 0.0–0.5)
Eosinophils Relative: 0 %
HCT: 31.9 % — ABNORMAL LOW (ref 36.0–46.0)
Hemoglobin: 9.8 g/dL — ABNORMAL LOW (ref 12.0–15.0)
Immature Granulocytes: 1 %
Lymphocytes Relative: 10 %
Lymphs Abs: 1.7 10*3/uL (ref 0.7–4.0)
MCH: 28.7 pg (ref 26.0–34.0)
MCHC: 30.7 g/dL (ref 30.0–36.0)
MCV: 93.5 fL (ref 80.0–100.0)
Monocytes Absolute: 1.7 10*3/uL — ABNORMAL HIGH (ref 0.1–1.0)
Monocytes Relative: 10 %
Neutro Abs: 14.1 10*3/uL — ABNORMAL HIGH (ref 1.7–7.7)
Neutrophils Relative %: 79 %
Platelets: 319 10*3/uL (ref 150–400)
RBC: 3.41 MIL/uL — ABNORMAL LOW (ref 3.87–5.11)
RDW: 13.1 % (ref 11.5–15.5)
WBC: 17.7 10*3/uL — ABNORMAL HIGH (ref 4.0–10.5)
nRBC: 0.2 % (ref 0.0–0.2)

## 2023-02-26 LAB — COMPREHENSIVE METABOLIC PANEL
ALT: 43 U/L (ref 0–44)
AST: 41 U/L (ref 15–41)
Albumin: 3.3 g/dL — ABNORMAL LOW (ref 3.5–5.0)
Alkaline Phosphatase: 71 U/L (ref 38–126)
Anion gap: 19 — ABNORMAL HIGH (ref 5–15)
BUN: 51 mg/dL — ABNORMAL HIGH (ref 8–23)
CO2: 22 mmol/L (ref 22–32)
Calcium: 11.1 mg/dL — ABNORMAL HIGH (ref 8.9–10.3)
Chloride: 99 mmol/L (ref 98–111)
Creatinine, Ser: 1.2 mg/dL — ABNORMAL HIGH (ref 0.44–1.00)
GFR, Estimated: 47 mL/min — ABNORMAL LOW (ref >60)
Glucose, Bld: 103 mg/dL — ABNORMAL HIGH (ref 70–99)
Potassium: 4.1 mmol/L (ref 3.5–5.1)
Sodium: 140 mmol/L (ref 135–145)
Total Bilirubin: 1.5 mg/dL — ABNORMAL HIGH (ref 0.0–1.2)
Total Protein: 6.4 g/dL — ABNORMAL LOW (ref 6.5–8.1)

## 2023-02-26 LAB — PROTIME-INR
INR: 1.1 (ref 0.8–1.2)
Prothrombin Time: 14.8 s (ref 11.4–15.2)

## 2023-02-26 LAB — APTT: aPTT: 26 s (ref 24–36)

## 2023-02-26 LAB — TSH: TSH: <0.01 u[IU]/mL — ABNORMAL LOW (ref 0.350–4.500)

## 2023-02-26 LAB — LACTIC ACID, PLASMA: Lactic Acid, Venous: 1.6 mmol/L (ref 0.5–1.9)

## 2023-02-26 MED ORDER — METRONIDAZOLE 500 MG/100ML IV SOLN
500.0000 mg | Freq: Once | INTRAVENOUS | Status: AC
  Administered 2023-02-26: 500 mg via INTRAVENOUS
  Filled 2023-02-26: qty 100

## 2023-02-26 MED ORDER — SODIUM CHLORIDE 0.9 % IV SOLN
2.0000 g | Freq: Once | INTRAVENOUS | Status: AC
  Administered 2023-02-26: 2 g via INTRAVENOUS
  Filled 2023-02-26: qty 12.5

## 2023-02-26 MED ORDER — VANCOMYCIN HCL IN DEXTROSE 1-5 GM/200ML-% IV SOLN
1000.0000 mg | Freq: Once | INTRAVENOUS | Status: AC
  Administered 2023-02-26: 1000 mg via INTRAVENOUS
  Filled 2023-02-26: qty 200

## 2023-02-26 MED ORDER — LACTATED RINGERS IV SOLN: INTRAVENOUS | Status: DC

## 2023-02-26 MED ORDER — LACTATED RINGERS IV BOLUS (SEPSIS)
1000.0000 mL | Freq: Once | INTRAVENOUS | Status: AC
  Administered 2023-02-26: 1000 mL via INTRAVENOUS

## 2023-02-26 MED ORDER — IOHEXOL 300 MG/ML  SOLN
100.0000 mL | Freq: Once | INTRAMUSCULAR | Status: AC | PRN
  Administered 2023-02-26: 100 mL via INTRAVENOUS

## 2023-02-26 NOTE — ED Triage Notes (Signed)
Pt presents to the ED via POV with complaints of possible dehydration and decrease appetite. Pt has been followed by Endocrine for thyroid cancer and she had a biopsy 2 weeks ago and since then she has become weaker. A&Ox4 at this time. Denies CP or SOB.

## 2023-02-26 NOTE — H&P (Addendum)
History and Physical    Patient: Carrie Fitzgerald WGN:[id number] DOB: January 16, 1946 DOA: 02/26/2023 DOS: the patient was seen and examined on 02/27/2023 PCP: Harley Hallmark, NP  Patient coming from: Home  Chief Complaint:  Chief Complaint  Patient presents with   Dehydration   HPI: Carrie Fitzgerald is a 78 y.o. female with medical history significant of Thyroid cancer status post recent biopsy who presents to the emergency department due to concern for dehydration.  Patient states that she has been having decreased fluid and food intake since she had biopsy for the thyroid cancer about 2 weeks ago.  She has been having increasing difficulty in being able to swallow food since the biopsy was done and patient has also been having increasing weakness.  She has lost about 12 pounds since onset of symptoms per caretaker at bedside.  She had a couple of episodes of nonbloody vomiting yesterday and has also had intermittent diarrhea.  She denies fever, chest pain, shortness of breath, cough  ED Course:  In the emergency department, patient was tachycardic, BP was 94/46, other vital signs were within normal range.  Workup in the ED showed WBC of 17.7 with a left shift, normocytic anemia.  BMP showed blood glucose of 103, BUN/creatinine 51/1.20, calcium 11.1, albumin 3.3, total bilirubin 1.5, anion gap 19, lactic acid 1.6, TSH less than 0.010.  Blood culture pending. CT neck with contrast showed markedly enlarged heterogeneous multinodular thyroid, with largest nodule on the left measuring up to 4.8 cm CT abdomen and pelvis with contrast showed no acute abnormality in the abdomen or pelvis.  Mild intra and extrahepatic biliary dilation, correlate with LFTs.  If abnormal consider MRCP or ERCP for further evaluation. She was treated with IV cefepime, Flagyl and vancomycin, IV hydration was provided. Hospitalist was asked to admit patient for further evaluation and management.  Review of Systems: Review of  systems as noted in the HPI. All other systems reviewed and are negative.   Past Medical History:  Diagnosis Date   Cancer (HCC)    ovarian   DJD (degenerative joint disease)    Left hip   Goiter    Hypokalemia    Hypothyroidism    Rheumatoid arthritis (HCC)    Vitamin D deficiency    Wears glasses    Past Surgical History:  Procedure Laterality Date   ABDOMINAL HYSTERECTOMY     BIOPSY THYROID  01/2023   CATARACT EXTRACTION W/ INTRAOCULAR LENS IMPLANT     Right eye   TOTAL HIP ARTHROPLASTY Left 10/25/2013   Procedure: TOTAL HIP ARTHROPLASTY ANTERIOR APPROACH;  Surgeon: Velna Ochs, MD;  Location: [facility] OR;  Service: Orthopedics;  Laterality: Left;    Social History:  reports that she has never smoked. She has never used smokeless tobacco. She reports that she does not drink alcohol and does not use drugs.   No Known Allergies  Family History  Problem Relation Age of Onset   Heart disease Father    Arthritis Other      Prior to Admission medications   Medication Sig Start Date End Date Taking? Authorizing Provider  Calcium Carbonate-Vitamin D (CALCIUM 600+D) 600-400 MG-UNIT per tablet Take 1 tablet by mouth daily.   Yes [provider]  folic acid (FOLVITE) 1 MG tablet Take 1 mg by mouth daily.   Yes [provider]  hydrochlorothiazide (HYDRODIURIL) 50 MG tablet Take 50 mg by mouth daily.   Yes [provider]  methotrexate (RHEUMATREX) 2.5 MG tablet Take 10  mg by mouth every Thursday. Caution:Chemotherapy. Protect from light.   Yes [provider]  metoprolol succinate (TOPROL-XL) 50 MG 24 hr tablet Take 1 tablet by mouth daily. 09/30/22  Yes [provider]  potassium chloride SA (K-DUR,KLOR-CON) 20 MEQ tablet Take 20 mEq by mouth daily.   Yes [provider]  rosuvastatin (CRESTOR) 5 MG tablet Take 1 tablet (5 mg total) by mouth at bedtime. 11/06/22  Yes Nida, Denman George, MD  traMADol (ULTRAM) 50 MG tablet  Take 50 mg by mouth every morning.   Yes [provider]    Physical Exam: BP (!) 87/45   Pulse 95   Temp 97.6 F (36.4 C) (Oral)   Resp 18   Ht 5\' 5"  (1.651 m)   Wt 63.3 kg   SpO2 99%   BMI 23.22 kg/m   General: 78 y.o. year-old female well developed well nourished in no acute distress.  Alert and oriented x3. HEENT: NCAT, EOMI, dry mucous membrane Neck: Supple, trachea medial Cardiovascular: Regular rate and rhythm with no rubs or gallops.  No thyromegaly or JVD noted.  No lower extremity edema. 2/4 pulses in all 4 extremities. Respiratory: Clear to auscultation with no wheezes or rales. Good inspiratory effort. Abdomen: Soft, nontender nondistended with normal bowel sounds x4 quadrants. Muskuloskeletal: No cyanosis, clubbing or edema noted bilaterally Neuro: CN II-XII intact, strength 5/5 x 4, sensation, reflexes intact Skin: Decreased skin turgor.  Noted sacral decubitus ulcer on right buttock with shearing on the left Psychiatry: Judgement and insight appear normal. Mood is appropriate for condition and setting          Labs on Admission:  Basic Metabolic Panel: Recent Labs  Lab 02/26/23 2108  NA 140  K 4.1  CL 99  CO2 22  GLUCOSE 103*  BUN 51*  CREATININE 1.20*  CALCIUM 11.1*   Liver Function Tests: Recent Labs  Lab 02/26/23 2108  AST 41  ALT 43  ALKPHOS 71  BILITOT 1.5*  PROT 6.4*  ALBUMIN 3.3*   No results for input(s): "LIPASE", "AMYLASE" in the last 168 hours. No results for input(s): "AMMONIA" in the last 168 hours. CBC: Recent Labs  Lab 02/26/23 2108  WBC 17.7*  NEUTROABS 14.1*  HGB 9.8*  HCT 31.9*  MCV 93.5  PLT 319   Cardiac Enzymes: No results for input(s): "CKTOTAL", "CKMB", "CKMBINDEX", "TROPONINI" in the last 168 hours.  BNP (last 3 results) No results for input(s): "BNP" in the last 8760 hours.  ProBNP (last 3 results) No results for input(s): "PROBNP" in the last 8760 hours.  CBG: No results for input(s):  "GLUCAP" in the last 168 hours.  Radiological Exams on Admission: CT Soft Tissue Neck W Contrast Result Date: 02/26/2023 CLINICAL DATA:  Initial evaluation for soft tissue infection suspected. History of thyroid cancer. EXAM: CT NECK WITH CONTRAST TECHNIQUE: Multidetector CT imaging of the neck was performed using the standard protocol following the bolus administration of intravenous contrast. RADIATION DOSE REDUCTION: This exam was performed according to the departmental dose-optimization program which includes automated exposure control, adjustment of the mA and/or kV according to patient size and/or use of iterative reconstruction technique. CONTRAST:  OMNIPAQUE IOHEXOL 300 MG/ML  SOLN COMPARISON:  None Available. FINDINGS: Pharynx and larynx: Evaluation of the oral cavity limited by streak artifact from dental amalgam. No visible abnormality about the dentition. Oropharynx and nasopharynx within normal limits. No retropharyngeal collection or swelling. Negative epiglottis. Hypopharynx and supraglottic larynx within normal limits. Negative glottis. Subglottic  airway clear. Salivary glands: Parotid and submandibular glands within normal limits. Thyroid: Markedly enlarged heterogeneous multinodular thyroid. Largest nodule on the left measures up to 4.8 cm. Heterogeneous enhancing nodular density with central hypodensity involving the subcutaneous fat overlies the thyroid at the left lower anterior neck, measuring up to 2.9 cm (series 5, image 70). Findings indeterminate, but suspected to be thyroidal in origin, possibly a nodal metastasis. Lymph nodes: No other pathologically enlarged lymph nodes within the neck. Vascular: Normal intravascular enhancement seen within the neck. Limited intracranial: Unremarkable. Visualized orbits: Prior ocular lens replacement. Otherwise unremarkable. Mastoids and visualized paranasal sinuses: Paranasal sinuses are clear. Visualized mastoids and middle ear cavities are  clear as well. Skeleton: No discrete or worrisome osseous lesions. Upper chest: No other acute finding. Other: None. IMPRESSION: 1. Markedly enlarged heterogeneous multinodular thyroid, with largest nodule on the left measuring up to 4.8 cm. Further evaluation with dedicated thyroid ultrasound recommended if not already performed. (Ref: J Am Coll Radiol. 2015 Feb;12(2): 143-50). 2. 2.9 cm heterogeneous enhancing nodule involving the subcutaneous fat overlying the thyroid at the left lower neck. Finding is indeterminate, but suspected to be thyroidal in origin, possibly a nodal metastasis. 3. No other acute abnormality within the neck. Electronically Signed   By: Rise Mu M.D.   On: 02/26/2023 22:42   CT ABDOMEN PELVIS W CONTRAST Result Date: 02/26/2023 CLINICAL DATA:  History of thyroid cancer. Weakness since biopsy 2 weeks ago. Abdominal pain. EXAM: CT ABDOMEN AND PELVIS WITH CONTRAST TECHNIQUE: Multidetector CT imaging of the abdomen and pelvis was performed using the standard protocol following bolus administration of intravenous contrast. RADIATION DOSE REDUCTION: This exam was performed according to the departmental dose-optimization program which includes automated exposure control, adjustment of the mA and/or kV according to patient size and/or use of iterative reconstruction technique. CONTRAST:  OMNIPAQUE IOHEXOL 300 MG/ML  SOLN COMPARISON:  None Available. FINDINGS: Lower chest: No acute abnormality. Hepatobiliary: No focal hepatic lesion. Mild intra and extrahepatic biliary dilation. The common bile duct measures 12 mm in diameter. Small amount of layering sludge in the gallbladder. Pancreas: Unremarkable. Spleen: Unremarkable. Adrenals/Urinary Tract: Normal adrenal glands. No urinary calculi or hydronephrosis. The bladder is obscured by streak artifact. Stomach/Bowel: Normal caliber large and small bowel without bowel wall thickening. Stomach and appendix are within normal limits.  Vascular/Lymphatic: Aortic atherosclerosis. No enlarged abdominal or pelvic lymph nodes. Reproductive: Hysterectomy. Other: No free intraperitoneal fluid or air. Musculoskeletal: Left THA.  No acute fracture. IMPRESSION: 1. No acute abnormality in the abdomen or pelvis. 2. Mild intra and extrahepatic biliary dilation. Correlate with LFTs. If abnormal consider MRCP or ERCP for further evaluation. Aortic Atherosclerosis (ICD10-I70.0). Electronically Signed   By: Minerva Fester M.D.   On: 02/26/2023 22:29   DG Chest Port 1 View Result Date: 02/26/2023 CLINICAL DATA:  Questionable sepsis EXAM: PORTABLE CHEST 1 VIEW COMPARISON:  10/18/2013 FINDINGS: Heart is borderline in size. Mediastinal contours within normal limits. No confluent opacities, effusions or edema. No acute bony abnormality. IMPRESSION: No active disease. Electronically Signed   By: Charlett Nose M.D.   On: 02/26/2023 22:20    EKG: I independently viewed the EKG done and my findings are as followed:    Assessment/Plan Present on Admission:  Mixed hyperlipidemia  Thyroid malignant neoplasm (HCC)  Multinodular goiter  Principal Problem:   Generalized weakness Active Problems:   Mixed hyperlipidemia   Thyroid malignant neoplasm (HCC)   Multinodular goiter   Failure to thrive in adult   Pressure  injury of skin   Hypoalbuminemia due to protein-calorie malnutrition (HCC)   Leukocytosis   Hypercalcemia   Essential hypertension   Sacral decubitus ulcer, stage II (HCC)  Generalized weakness Failure to thrive in adult Hypoalbuminemia secondary to mild protein calorie malnutrition Protein supplement will be provided Speech therapist will be consulted for swallow eval Dietitian will be consulted admission await further recommendations  Leukocytosis possibly due to stress demargination No current obvious acute infectious process Continue to monitor WBC with morning labs  Hypercalcemia Calcium 11.1, this is possibly due to  hypercalcemia of malignancy IV hydration was provided  Sacral decubitus ulcer stage II Continue wound care  Essential hypertension BP meds will be held at this time due to soft BP  Mixed hyperlipidemia Continue Crestor  Thyroid malignant neoplasm Multinodular goiter Patient recently had thyroid biopsy Patient has a pending ambulatory referral to general surgery CT neck with contrast showed markedly enlarged heterogeneous multinodular thyroid, with largest nodule on the left measuring up to 4.8 cm.  US thyroid to be done in the morning  DVT prophylaxis: Lovenox  Code Status: Full code  Family Communication: Family at bedside (all questions answered to satisfaction)  Consults: Dietitian  Severity of Illness: The appropriate patient status for this patient is INPATIENT. Inpatient status is judged to be reasonable and necessary in order to provide the required intensity of service to ensure the patient's safety. The patient's presenting symptoms, physical exam findings, and initial radiographic and laboratory data in the context of their chronic comorbidities is felt to place them at high risk for further clinical deterioration. Furthermore, it is not anticipated that the patient will be medically stable for discharge from the hospital within 2 midnights of admission.   * I certify that at the point of admission it is my clinical judgment that the patient will require inpatient hospital care spanning beyond 2 midnights from the point of admission due to high intensity of service, high risk for further deterioration and high frequency of surveillance required.*  Author: Frankey Shown, DO 02/27/2023 3:32 AM  For on call review [email or web address].

## 2023-02-26 NOTE — Sepsis Progress Note (Signed)
Following for sepsis monitoring ?

## 2023-02-26 NOTE — ED Notes (Signed)
Patient currently in CT.  Will start antibiotics when patient returns

## 2023-02-26 NOTE — ED Provider Notes (Addendum)
[place] EMERGENCY DEPARTMENT AT [facility] Provider Note   CSN: [id number] Arrival date & time: 02/26/23  2041     History  Chief Complaint  Patient presents with   Dehydration    Carrie Fitzgerald is a 78 y.o. female.  78 year old female with past medical history of thyroid cancer with recent biopsy presents the emergency department today with decreased appetite as well as some intermittent abdominal discomfort and trouble swallowing.  The patient has not really had much to eat or drink over the week.  She was brought in today by her family because she was feeling generally weak.  She recently had a thyroid biopsy but is not currently being treated for thyroid cancer at this time.  She has been afebrile.  Denies any cough.        Home Medications Prior to Admission medications   Medication Sig Start Date End Date Taking? Authorizing Provider  Calcium Carbonate-Vitamin D (CALCIUM 600+D) 600-400 MG-UNIT per tablet Take 1 tablet by mouth daily.   Yes [provider]  folic acid (FOLVITE) 1 MG tablet Take 1 mg by mouth daily.   Yes [provider]  hydrochlorothiazide (HYDRODIURIL) 50 MG tablet Take 50 mg by mouth daily.   Yes [provider]  methotrexate (RHEUMATREX) 2.5 MG tablet Take 10 mg by mouth every Thursday. Caution:Chemotherapy. Protect from light.   Yes [provider]  metoprolol succinate (TOPROL-XL) 50 MG 24 hr tablet Take 1 tablet by mouth daily. 09/30/22  Yes [provider]  potassium chloride SA (K-DUR,KLOR-CON) 20 MEQ tablet Take 20 mEq by mouth daily.   Yes [provider]  rosuvastatin (CRESTOR) 5 MG tablet Take 1 tablet (5 mg total) by mouth at bedtime. 11/06/22  Yes Nida, Denman George, MD  traMADol (ULTRAM) 50 MG tablet Take 50 mg by mouth every morning.   Yes [provider]      Allergies    Patient has no known allergies.    Review of Systems   Review of Systems   Constitutional:  Positive for appetite change.  Gastrointestinal:  Positive for abdominal pain.  All other systems reviewed and are negative.   Physical Exam Updated Vital Signs BP (!) 133/55   Pulse (!) 111   Temp 97.8 F (36.6 C) (Oral)   Resp 18   Ht 5\' 6"  (1.676 m)   Wt 65.6 kg   SpO2 100%   BMI 23.34 kg/m  Physical Exam Vitals and nursing note reviewed.   Gen: NAD, chronically ill-appearing Eyes: PERRL, EOMI HEENT: no oropharyngeal swelling, dry mucous membranes Neck: trachea midline Resp: clear to auscultation bilaterally Card: Tachycardic, no murmurs, rubs, or gallops Abd: nontender, nondistended Extremities: no calf tenderness, no edema Vascular: 2+ radial pulses bilaterally, 2+ DP pulses bilaterally Skin: no rashes Psyc: acting appropriately   ED Results / Procedures / Treatments   Labs (all labs ordered are listed, but only abnormal results are displayed) Labs Reviewed  CBC WITH DIFFERENTIAL/PLATELET - Abnormal; Notable for the following components:      Result Value   WBC 17.7 (*)    RBC 3.41 (*)    Hemoglobin 9.8 (*)    HCT 31.9 (*)    Neutro Abs 14.1 (*)    Monocytes Absolute 1.7 (*)    Abs Immature Granulocytes 0.09 (*)    All other components within normal limits  COMPREHENSIVE METABOLIC PANEL - Abnormal; Notable for the following components:   Glucose, Bld 103 (*)    BUN  51 (*)    Creatinine, Ser 1.20 (*)    Calcium 11.1 (*)    Total Protein 6.4 (*)    Albumin 3.3 (*)    Total Bilirubin 1.5 (*)    GFR, Estimated 47 (*)    Anion gap 19 (*)    All other components within normal limits  CULTURE, BLOOD (ROUTINE X 2)  CULTURE, BLOOD (ROUTINE X 2)  LACTIC ACID, PLASMA  PROTIME-INR  APTT  URINALYSIS, W/ REFLEX TO CULTURE (INFECTION SUSPECTED)  TSH    EKG None  Radiology CT Soft Tissue Neck W Contrast Result Date: 02/26/2023 CLINICAL DATA:  Initial evaluation for soft tissue infection suspected. History of thyroid cancer. EXAM: CT NECK  WITH CONTRAST TECHNIQUE: Multidetector CT imaging of the neck was performed using the standard protocol following the bolus administration of intravenous contrast. RADIATION DOSE REDUCTION: This exam was performed according to the departmental dose-optimization program which includes automated exposure control, adjustment of the mA and/or kV according to patient size and/or use of iterative reconstruction technique. CONTRAST:  OMNIPAQUE IOHEXOL 300 MG/ML  SOLN COMPARISON:  None Available. FINDINGS: Pharynx and larynx: Evaluation of the oral cavity limited by streak artifact from dental amalgam. No visible abnormality about the dentition. Oropharynx and nasopharynx within normal limits. No retropharyngeal collection or swelling. Negative epiglottis. Hypopharynx and supraglottic larynx within normal limits. Negative glottis. Subglottic airway clear. Salivary glands: Parotid and submandibular glands within normal limits. Thyroid: Markedly enlarged heterogeneous multinodular thyroid. Largest nodule on the left measures up to 4.8 cm. Heterogeneous enhancing nodular density with central hypodensity involving the subcutaneous fat overlies the thyroid at the left lower anterior neck, measuring up to 2.9 cm (series 5, image 70). Findings indeterminate, but suspected to be thyroidal in origin, possibly a nodal metastasis. Lymph nodes: No other pathologically enlarged lymph nodes within the neck. Vascular: Normal intravascular enhancement seen within the neck. Limited intracranial: Unremarkable. Visualized orbits: Prior ocular lens replacement. Otherwise unremarkable. Mastoids and visualized paranasal sinuses: Paranasal sinuses are clear. Visualized mastoids and middle ear cavities are clear as well. Skeleton: No discrete or worrisome osseous lesions. Upper chest: No other acute finding. Other: None. IMPRESSION: 1. Markedly enlarged heterogeneous multinodular thyroid, with largest nodule on the left measuring up to 4.8  cm. Further evaluation with dedicated thyroid ultrasound recommended if not already performed. (Ref: J Am Coll Radiol. 2015 Feb;12(2): 143-50). 2. 2.9 cm heterogeneous enhancing nodule involving the subcutaneous fat overlying the thyroid at the left lower neck. Finding is indeterminate, but suspected to be thyroidal in origin, possibly a nodal metastasis. 3. No other acute abnormality within the neck. Electronically Signed   By: Rise Mu M.D.   On: 02/26/2023 22:42   CT ABDOMEN PELVIS W CONTRAST Result Date: 02/26/2023 CLINICAL DATA:  History of thyroid cancer. Weakness since biopsy 2 weeks ago. Abdominal pain. EXAM: CT ABDOMEN AND PELVIS WITH CONTRAST TECHNIQUE: Multidetector CT imaging of the abdomen and pelvis was performed using the standard protocol following bolus administration of intravenous contrast. RADIATION DOSE REDUCTION: This exam was performed according to the departmental dose-optimization program which includes automated exposure control, adjustment of the mA and/or kV according to patient size and/or use of iterative reconstruction technique. CONTRAST:  OMNIPAQUE IOHEXOL 300 MG/ML  SOLN COMPARISON:  None Available. FINDINGS: Lower chest: No acute abnormality. Hepatobiliary: No focal hepatic lesion. Mild intra and extrahepatic biliary dilation. The common bile duct measures 12 mm in diameter. Small amount of layering sludge in the gallbladder. Pancreas: Unremarkable. Spleen: Unremarkable. Adrenals/Urinary  Tract: Normal adrenal glands. No urinary calculi or hydronephrosis. The bladder is obscured by streak artifact. Stomach/Bowel: Normal caliber large and small bowel without bowel wall thickening. Stomach and appendix are within normal limits. Vascular/Lymphatic: Aortic atherosclerosis. No enlarged abdominal or pelvic lymph nodes. Reproductive: Hysterectomy. Other: No free intraperitoneal fluid or air. Musculoskeletal: Left THA.  No acute fracture. IMPRESSION: 1. No acute  abnormality in the abdomen or pelvis. 2. Mild intra and extrahepatic biliary dilation. Correlate with LFTs. If abnormal consider MRCP or ERCP for further evaluation. Aortic Atherosclerosis (ICD10-I70.0). Electronically Signed   By: Minerva Fester M.D.   On: 02/26/2023 22:29   DG Chest Port 1 View Result Date: 02/26/2023 CLINICAL DATA:  Questionable sepsis EXAM: PORTABLE CHEST 1 VIEW COMPARISON:  10/18/2013 FINDINGS: Heart is borderline in size. Mediastinal contours within normal limits. No confluent opacities, effusions or edema. No acute bony abnormality. IMPRESSION: No active disease. Electronically Signed   By: Charlett Nose M.D.   On: 02/26/2023 22:20    Procedures Procedures    Medications Ordered in ED Medications  lactated ringers infusion (has no administration in time range)  ceFEPIme (MAXIPIME) 2 g in sodium chloride 0.9 % 100 mL IVPB (2 g Intravenous New Bag/Given 02/26/23 2223)  metroNIDAZOLE (FLAGYL) IVPB 500 mg (500 mg Intravenous New Bag/Given 02/26/23 2243)  vancomycin (VANCOCIN) IVPB 1000 mg/200 mL premix (has no administration in time range)  lactated ringers bolus 1,000 mL (1,000 mLs Intravenous New Bag/Given 02/26/23 2137)  iohexol (OMNIPAQUE) 300 MG/ML solution 100 mL (100 mLs Intravenous Contrast Given 02/26/23 2156)    ED Course/ Medical Decision Making/ A&P                                 Medical Decision Making 78 year old female with past medical history of thyroid cancer as well as hypertension presenting to the emergency department today with abdominal discomfort and decreased appetite.  The patient is tachycardic and hypotensive here on arrival.  I will initiate a sepsis workup on the patient here.  Unsure if this is actually due to sepsis or possibly severe dehydration given her decreased oral intake.  The patient IV fluids.  Will hold off on antibiotics until a potential source is identified or if she has significant leukocytosis.  The patient's white blood cell  count is significantly elevated.  She is covered empirically with broad-spectrum antibiotic coverage.  Her CT scan of her soft tissue of her neck does not show any acute findings but does show the known thyroid issues.  CT scan does show some biliary ductal dilation.  Her total bilirubin is elevated at 1.5 although the remainder of her LFTs are within normal limits.  Her blood pressures did improve here.  Given her symptoms a call is placed to the hospitalist service for admission for MRCP and fluids for further evaluation.  Amount and/or Complexity of Data Reviewed Labs: ordered. Radiology: ordered.  Risk Prescription drug management. Decision regarding hospitalization.           Final Clinical Impression(s) / ED Diagnoses Final diagnoses:  Leukocytosis, unspecified type  Bile duct abnormality  AKI (acute kidney injury) [facility])    Rx / DC Orders ED Discharge Orders     None         Durwin Glaze, MD 02/26/23 2256    Durwin Glaze, MD 02/26/23 [phone]

## 2023-02-27 ENCOUNTER — Other Ambulatory Visit: Payer: Self-pay

## 2023-02-27 ENCOUNTER — Inpatient Hospital Stay (HOSPITAL_COMMUNITY): Payer: 59

## 2023-02-27 ENCOUNTER — Encounter (HOSPITAL_COMMUNITY): Payer: Self-pay | Admitting: Internal Medicine

## 2023-02-27 DIAGNOSIS — I10 Essential (primary) hypertension: Secondary | ICD-10-CM | POA: Diagnosis not present

## 2023-02-27 DIAGNOSIS — R627 Adult failure to thrive: Secondary | ICD-10-CM | POA: Diagnosis not present

## 2023-02-27 DIAGNOSIS — D72829 Elevated white blood cell count, unspecified: Secondary | ICD-10-CM | POA: Insufficient documentation

## 2023-02-27 DIAGNOSIS — R531 Weakness: Secondary | ICD-10-CM | POA: Diagnosis not present

## 2023-02-27 DIAGNOSIS — E8809 Other disorders of plasma-protein metabolism, not elsewhere classified: Secondary | ICD-10-CM | POA: Insufficient documentation

## 2023-02-27 DIAGNOSIS — L8990 Pressure ulcer of unspecified site, unspecified stage: Secondary | ICD-10-CM | POA: Insufficient documentation

## 2023-02-27 DIAGNOSIS — R6510 Systemic inflammatory response syndrome (SIRS) of non-infectious origin without acute organ dysfunction: Secondary | ICD-10-CM | POA: Diagnosis not present

## 2023-02-27 DIAGNOSIS — L89152 Pressure ulcer of sacral region, stage 2: Secondary | ICD-10-CM | POA: Insufficient documentation

## 2023-02-27 DIAGNOSIS — E46 Unspecified protein-calorie malnutrition: Secondary | ICD-10-CM | POA: Insufficient documentation

## 2023-02-27 LAB — COMPREHENSIVE METABOLIC PANEL
ALT: 30 U/L (ref 0–44)
AST: 29 U/L (ref 15–41)
Albumin: 2.4 g/dL — ABNORMAL LOW (ref 3.5–5.0)
Alkaline Phosphatase: 53 U/L (ref 38–126)
Anion gap: 10 (ref 5–15)
BUN: 49 mg/dL — ABNORMAL HIGH (ref 8–23)
CO2: 22 mmol/L (ref 22–32)
Calcium: 9.8 mg/dL (ref 8.9–10.3)
Chloride: 104 mmol/L (ref 98–111)
Creatinine, Ser: 0.95 mg/dL (ref 0.44–1.00)
GFR, Estimated: >60 mL/min (ref >60)
Glucose, Bld: 88 mg/dL (ref 70–99)
Potassium: 3.4 mmol/L — ABNORMAL LOW (ref 3.5–5.1)
Sodium: 136 mmol/L (ref 135–145)
Total Bilirubin: 1.3 mg/dL — ABNORMAL HIGH (ref 0.0–1.2)
Total Protein: 4.7 g/dL — ABNORMAL LOW (ref 6.5–8.1)

## 2023-02-27 LAB — VITAMIN B12: Vitamin B-12: 1081 pg/mL — ABNORMAL HIGH (ref 180–914)

## 2023-02-27 LAB — MAGNESIUM: Magnesium: 1.4 mg/dL — ABNORMAL LOW (ref 1.7–2.4)

## 2023-02-27 LAB — URINALYSIS, W/ REFLEX TO CULTURE (INFECTION SUSPECTED)
Bilirubin Urine: NEGATIVE
Glucose, UA: NEGATIVE mg/dL
Ketones, ur: 5 mg/dL — AB
Nitrite: NEGATIVE
Protein, ur: NEGATIVE mg/dL
Specific Gravity, Urine: 1.032 — ABNORMAL HIGH (ref 1.005–1.030)
pH: 5 (ref 5.0–8.0)

## 2023-02-27 LAB — CBC
HCT: 23 % — ABNORMAL LOW (ref 36.0–46.0)
Hemoglobin: 7 g/dL — ABNORMAL LOW (ref 12.0–15.0)
MCH: 28.2 pg (ref 26.0–34.0)
MCHC: 30.4 g/dL (ref 30.0–36.0)
MCV: 92.7 fL (ref 80.0–100.0)
Platelets: 179 10*3/uL (ref 150–400)
RBC: 2.48 MIL/uL — ABNORMAL LOW (ref 3.87–5.11)
RDW: 13.2 % (ref 11.5–15.5)
WBC: 11.6 10*3/uL — ABNORMAL HIGH (ref 4.0–10.5)
nRBC: 0 % (ref 0.0–0.2)

## 2023-02-27 LAB — PROCALCITONIN: Procalcitonin: 0.18 ng/mL

## 2023-02-27 LAB — PHOSPHORUS: Phosphorus: 3.5 mg/dL (ref 2.5–4.6)

## 2023-02-27 LAB — SARS CORONAVIRUS 2 BY RT PCR: SARS Coronavirus 2 by RT PCR: NEGATIVE

## 2023-02-27 LAB — T4, FREE: Free T4: 5.49 ng/dL — ABNORMAL HIGH (ref 0.61–1.12)

## 2023-02-27 LAB — FOLATE: Folate: >40 ng/mL (ref >5.9)

## 2023-02-27 LAB — PREPARE RBC (CROSSMATCH)

## 2023-02-27 MED ORDER — ONDANSETRON HCL 4 MG PO TABS: 4.0000 mg | ORAL_TABLET | Freq: Four times a day (QID) | ORAL | Status: DC | PRN

## 2023-02-27 MED ORDER — ONDANSETRON HCL 4 MG/2ML IJ SOLN
4.0000 mg | Freq: Four times a day (QID) | INTRAMUSCULAR | Status: DC | PRN
  Administered 2023-02-28: 4 mg via INTRAVENOUS
  Filled 2023-02-27 (×2): qty 2

## 2023-02-27 MED ORDER — SODIUM CHLORIDE 0.9% IV SOLUTION: Freq: Once | INTRAVENOUS | Status: DC

## 2023-02-27 MED ORDER — ACETAMINOPHEN 325 MG PO TABS
650.0000 mg | ORAL_TABLET | Freq: Four times a day (QID) | ORAL | Status: DC | PRN
  Administered 2023-02-28 – 2023-03-05 (×3): 650 mg via ORAL
  Filled 2023-02-27 (×3): qty 2

## 2023-02-27 MED ORDER — VANCOMYCIN HCL IN DEXTROSE 1-5 GM/200ML-% IV SOLN
1000.0000 mg | INTRAVENOUS | Status: DC
  Administered 2023-02-27 – 2023-03-01 (×3): 1000 mg via INTRAVENOUS
  Filled 2023-02-27 (×3): qty 200

## 2023-02-27 MED ORDER — ACETAMINOPHEN 650 MG RE SUPP: 650.0000 mg | Freq: Four times a day (QID) | RECTAL | Status: DC | PRN

## 2023-02-27 MED ORDER — ENOXAPARIN SODIUM 40 MG/0.4ML IJ SOSY
40.0000 mg | PREFILLED_SYRINGE | INTRAMUSCULAR | Status: DC
  Administered 2023-02-27 – 2023-03-04 (×6): 40 mg via SUBCUTANEOUS
  Filled 2023-02-27 (×6): qty 0.4

## 2023-02-27 MED ORDER — LACTATED RINGERS IV BOLUS
500.0000 mL | Freq: Once | INTRAVENOUS | Status: AC
  Administered 2023-02-27: 500 mL via INTRAVENOUS

## 2023-02-27 MED ORDER — SODIUM CHLORIDE 0.9 % IV SOLN
2.0000 g | Freq: Two times a day (BID) | INTRAVENOUS | Status: DC
  Administered 2023-02-27 – 2023-03-01 (×5): 2 g via INTRAVENOUS
  Filled 2023-02-27 (×5): qty 12.5

## 2023-02-27 MED ORDER — POTASSIUM CHLORIDE CRYS ER 20 MEQ PO TBCR
40.0000 meq | EXTENDED_RELEASE_TABLET | Freq: Once | ORAL | Status: AC
  Administered 2023-02-27: 40 meq via ORAL
  Filled 2023-02-27: qty 2

## 2023-02-27 MED ORDER — SODIUM CHLORIDE 0.9 % IV SOLN: INTRAVENOUS | Status: AC

## 2023-02-27 MED ORDER — MAGNESIUM SULFATE 2 GM/50ML IV SOLN
2.0000 g | Freq: Once | INTRAVENOUS | Status: AC
  Administered 2023-02-27: 2 g via INTRAVENOUS
  Filled 2023-02-27: qty 50

## 2023-02-27 MED ORDER — ROSUVASTATIN CALCIUM 10 MG PO TABS
5.0000 mg | ORAL_TABLET | Freq: Every day | ORAL | Status: DC
  Administered 2023-02-27 – 2023-03-01 (×3): 5 mg via ORAL
  Filled 2023-02-27 (×3): qty 1

## 2023-02-27 MED ORDER — METHIMAZOLE 5 MG PO TABS: 5.0000 mg | ORAL_TABLET | Freq: Every day | ORAL | Status: DC

## 2023-02-27 MED ORDER — METHIMAZOLE 5 MG PO TABS
5.0000 mg | ORAL_TABLET | Freq: Two times a day (BID) | ORAL | Status: DC
  Administered 2023-02-27 – 2023-03-06 (×15): 5 mg via ORAL
  Filled 2023-02-27 (×15): qty 1

## 2023-02-27 MED ORDER — ENSURE ENLIVE PO LIQD
237.0000 mL | Freq: Two times a day (BID) | ORAL | Status: DC
Start: 2023-02-27 — End: 2023-03-07
  Administered 2023-02-27 – 2023-03-04 (×10): 237 mL via ORAL

## 2023-02-27 NOTE — Evaluation (Signed)
Physical Therapy Evaluation Patient Details Name: Carrie Fitzgerald MRN: [id number] DOB: [birth year] Today's Date: 02/27/2023  History of Present Illness  Carrie Fitzgerald is a 78 y.o. female with medical history significant of Thyroid cancer status post recent biopsy who presents to the emergency department due to concern for dehydration.  Patient states that she has been having decreased fluid and food intake since she had biopsy for the thyroid cancer about 2 weeks ago.  She has been having increasing difficulty in being able to swallow food since the biopsy was done and patient has also been having increasing weakness.  She has lost about 12 pounds since onset of symptoms per caretaker at bedside.  She had a couple of episodes of nonbloody vomiting yesterday and has also had intermittent diarrhea.  She denies fever, chest pain, shortness of breath, cough    Clinical Impression  Patient lying in bed on therapist arrival; agreeable to therapist assessment.  Patient needs mod to occasional max assist for supine to sit with HOB elevated due to general weakness.  Patient needs assist to come fully to the edge of the bed and demonstrates poor to fair sitting balance with a tendency to lean backwards; demonstrates poor trunk control and decreased core strength.  Patient needs max assist for initial boost up to standing to RW and then mod A for step pivot to chair demonstrating leg weakness and poor to fair standing balance with RW.  patient left in chair with call button in reach, chair alarm set and nursing notified of mobility status. . Pt demonstrating significant functional limitations in mobility, transfers, ambulation and ADLs due to muscle weakness, deconditioning, and pain. Based upon these deficits/impairments, patient will benefit from continued skilled physical therapy services during remainder of hospital stay and at the next recommended venue of care to address deficits and promote return to  optimal function.         If plan is discharge home, recommend the following: A lot of help with walking and/or transfers;A lot of help with bathing/dressing/bathroom;Assistance with cooking/housework   Can travel by private vehicle   No    Equipment Recommendations None recommended by PT  Recommendations for Other Services       Functional Status Assessment Patient has had a recent decline in their functional status and demonstrates the ability to make significant improvements in function in a reasonable and predictable amount of time.     Precautions / Restrictions Precautions Precautions: Fall Restrictions Weight Bearing Restrictions Per Provider Order: No      Mobility  Bed Mobility Overal bed mobility: Needs Assistance Bed Mobility: Supine to Sit     Supine to sit: Mod assist     General bed mobility comments: mod assist to bring trunk fully upright and to come fully to the edge of the bed    Transfers Overall transfer level: Needs assistance Equipment used: Rolling walker (2 wheels) Transfers: Sit to/from Stand, Bed to chair/wheelchair/BSC Sit to Stand: Max assist, Mod assist   Step pivot transfers: Mod assist, Max assist       General transfer comment: max assist for initial boost; then mod assist to take a step over to the chair    Ambulation/Gait Ambulation/Gait assistance: Mod assist Gait Distance (Feet): 1 Feet Assistive device: Rolling walker (2 wheels) Gait Pattern/deviations: Trunk flexed, Decreased step length - right, Decreased step length - left       General Gait Details: very unsteady  Stairs  Wheelchair Mobility     Tilt Bed    Modified Rankin (Stroke Patients Only)       Balance Overall balance assessment: Needs assistance Sitting-balance support: Feet supported, Bilateral upper extremity supported Sitting balance-Leahy Scale: Fair Sitting balance - Comments: fair sitting balance on edge of bed; tends to  lean back Postural control: Posterior lean Standing balance support: Bilateral upper extremity supported, Reliant on assistive device for balance, During functional activity Standing balance-Leahy Scale: Fair Standing balance comment: poor to fair standing balance with RW and PT mod assist                             Pertinent Vitals/Pain Pain Assessment Pain Assessment: 0-10 Pain Score: 5  Pain Location: purewick? sacral area Pain Intervention(s): Monitored during session    Home Living Family/patient expects to be discharged to:: Private residence Living Arrangements: Alone Available Help at Discharge: Personal care attendant (sitter 4 hours a day Monday - Friday) Type of Home: House Home Access: Level entry       Home Layout: One level Home Equipment: Grab bars - tub/shower;Cane - single point;Shower seat;Hand held Programmer, systems (2 wheels);Wheelchair - manual      Prior Function Prior Level of Function : Needs assist       Physical Assist : Mobility (physical);ADLs (physical) Mobility (physical): Gait;Transfers ADLs (physical): Bathing Mobility Comments: needs Assist for tub/shower transfer ADLs Comments: aid assists her with cooking, cleaning and bathing     Extremity/Trunk Assessment   Upper Extremity Assessment Upper Extremity Assessment: Generalized weakness    Lower Extremity Assessment Lower Extremity Assessment: Generalized weakness    Cervical / Trunk Assessment Cervical / Trunk Assessment: Kyphotic  Communication   Communication Communication: No apparent difficulties  Cognition Arousal: Alert Behavior During Therapy: WFL for tasks assessed/performed Overall Cognitive Status: Within Functional Limits for tasks assessed                                          General Comments      Exercises     Assessment/Plan    PT Assessment Patient needs continued PT services  PT Problem List Decreased  strength;Pain;Decreased activity tolerance;Decreased balance;Decreased mobility       PT Treatment Interventions Balance training;Gait training;Functional mobility training;Therapeutic activities;Therapeutic exercise;Patient/family education    PT Goals (Current goals can be found in the Care Plan section)  Acute Rehab PT Goals Patient Stated Goal: return home after rehab PT Goal Formulation: With patient Time For Goal Achievement: 03/13/23 Potential to Achieve Goals: Good    Frequency Min 3X/week     Co-evaluation               AM-PAC PT "6 Clicks" Mobility  Outcome Measure Help needed turning from your back to your side while in a flat bed without using bedrails?: A Lot Help needed moving from lying on your back to sitting on the side of a flat bed without using bedrails?: A Lot Help needed moving to and from a bed to a chair (including a wheelchair)?: A Lot Help needed standing up from a chair using your arms (e.g., wheelchair or bedside chair)?: A Lot Help needed to walk in hospital room?: A Lot Help needed climbing 3-5 steps with a railing? : Total 6 Click Score: 11    End of Session Equipment Utilized During  Treatment: Gait belt Activity Tolerance: Patient limited by fatigue Patient left: in chair;with call bell/phone within reach;with chair alarm set Nurse Communication: Mobility status PT Visit Diagnosis: Unsteadiness on feet (R26.81);Muscle weakness (generalized) (M62.81);Other abnormalities of gait and mobility (R26.89)    Time: 1610-9604 PT Time Calculation (min) (ACUTE ONLY): 20 min   Charges:   PT Evaluation $PT Eval Low Complexity: 1 Low   PT General Charges $$ ACUTE PT VISIT: 1 Visit         10:40 AM, 02/27/23 Carvel Huskins Small Sheronda Parran MPT [place] [facility] [place] [phone] [phone]

## 2023-02-27 NOTE — Plan of Care (Signed)
  Problem: Acute Rehab PT Goals(only PT should resolve) Goal: Pt Will Go Supine/Side To Sit Outcome: Progressing Flowsheets (Taken 02/27/2023 1041) Pt will go Supine/Side to Sit: with minimal assist Goal: Patient Will Transfer Sit To/From Stand Outcome: Progressing Flowsheets (Taken 02/27/2023 1041) Patient will transfer sit to/from stand:  with minimal assist  with moderate assist Goal: Pt Will Transfer Bed To Chair/Chair To Bed Outcome: Progressing Flowsheets (Taken 02/27/2023 1041) Pt will Transfer Bed to Chair/Chair to Bed:  with min assist  with mod assist Goal: Pt Will Ambulate Outcome: Progressing Flowsheets (Taken 02/27/2023 1041) Pt will Ambulate:  15 feet  with rolling walker  with minimal assist

## 2023-02-27 NOTE — Evaluation (Signed)
Clinical/Bedside Swallow Evaluation Patient Details  Name: Carrie Fitzgerald MRN: [id number] Date of Birth: 1946/01/23  Today's Date: 02/27/2023 Time: SLP Start Time (ACUTE ONLY): 1030 SLP Stop Time (ACUTE ONLY): 1056 SLP Time Calculation (min) (ACUTE ONLY): 26 min  Past Medical History:  Past Medical History:  Diagnosis Date   Cancer (HCC)    ovarian   DJD (degenerative joint disease)    Left hip   Goiter    Hypokalemia    Hypothyroidism    Rheumatoid arthritis (HCC)    Vitamin D deficiency    Wears glasses    Past Surgical History:  Past Surgical History:  Procedure Laterality Date   ABDOMINAL HYSTERECTOMY     BIOPSY THYROID  01/2023   CATARACT EXTRACTION W/ INTRAOCULAR LENS IMPLANT     Right eye   TOTAL HIP ARTHROPLASTY Left 10/25/2013   Procedure: TOTAL HIP ARTHROPLASTY ANTERIOR APPROACH;  Surgeon: Velna Ochs, MD;  Location: [facility] OR;  Service: Orthopedics;  Laterality: Left;   HPI:  78 year old female with a history of multinodular goiter, suspected thyroid malignancy, ovarian cancer, vitamin D deficiency, rheumatoid arthritis, hypertension presenting with generalized weakness.  The patient is a difficult historian.  History is supplemented by review of the medical record.  The patient had biopsy of her thyroid on 01/21/2023.  She followed up with her endocrinologist, Dr. Fransico Him on 02/10/2023.  At that time, the pathology from her biopsy was reviewed.  The cytology showed atypia of undetermined significance.  A sample was sent for Afrima testing which revealed 50% risk of malignancy.  There were also TERT positive findings of evidence of 90% risk of malignancy.  The patient was referred to Dr. Darnell Level for thyroidectomy.  She is rescheduled to follow-up with Dr. Fransico Him on 04/02/2023.  In the interim, the patient states that she has had some difficulty swallowing some solid foods and pills.  She has not had any difficulty swallowing liquids.  She denies any pain in her neck.  She  denies any drainage or open wounds in her neck.  There is not been any worsening swelling or erythema in her neck. The patient presented because of decreased oral intake and increasing generalized weakness. BSE requested   02/26/2023 CT neck soft tissues--oropharynx and nasopharynx WNL; no retropharyngeal collection or swelling.  Normal epiglottis.  Hypopharynx and supraglottic larynx WNL.  Enlarged heterogenous multinodular thyroid with largest nodule 4.8 cm. 2.9 cm heterogeneous enhancing nodule involving the subcutaneous fat overlying the thyroid at the left lower neck.    Assessment / Plan / Recommendation  Clinical Impression  Clinical swallowing evaluation completed while Pt was sitting upright in chair. Note generalized oral weakness. Pt consumed thin liquids without overt s/sx of oropharyngeal dysphagia. She consumed regular textures with prolonged mastication and note facial grimace with swallowing; she denies odynophagia but reports it's just "hard for solid stuff to go down". Pt states swallowing solid textures is more "uncomfortable" rather than painful. Pt consumed mech soft (canned peaches) with some prolonged mastication but no facial grimacing and reports of no discomfort. Recommend downgrade Pt's diet to D2/fine chop diet and continue with thin liquids. Pt also reports difficulty with large pills; recommend crush large pills if able and administer with puree. As there are no noted overt s/sx of aspiration, do not recommend pursue objective swallowing assessment at this time. If MD would like MBSS to be complete, our service will do so, please order. Our service will follow acutely X1 to ensure diet tolerance. Thank you for  this referral, SLP Visit Diagnosis: Dysphagia, unspecified (R13.10)    Aspiration Risk       Diet Recommendation Dysphagia 2 (Fine chop);Thin liquid    Liquid Administration via: Cup;Straw Medication Administration: Whole meds with liquid (crush large pills if  able) Supervision: Patient able to self feed;Staff to assist with self feeding;Intermittent supervision to cue for compensatory strategies Compensations: Slow rate;Small sips/bites;Follow solids with liquid Postural Changes: Seated upright at 90 degrees    Other  Recommendations Oral Care Recommendations: Oral care BID    Recommendations for follow up therapy are one component of a multi-disciplinary discharge planning process, led by the attending physician.  Recommendations may be updated based on patient status, additional functional criteria and insurance authorization.        Functional Status Assessment Patient has had a recent decline in their functional status and demonstrates the ability to make significant improvements in function in a reasonable and predictable amount of time.  Frequency and Duration min 1 x/week  1 week        Swallow Study   General Date of Onset: 02/26/23 HPI: 78 year old female with a history of multinodular goiter, suspected thyroid malignancy, ovarian cancer, vitamin D deficiency, rheumatoid arthritis, hypertension presenting with generalized weakness.  The patient is a difficult historian.  History is supplemented by review of the medical record.  The patient had biopsy of her thyroid on 01/21/2023.  She followed up with her endocrinologist, Dr. Fransico Him on 02/10/2023.  At that time, the pathology from her biopsy was reviewed.  The cytology showed atypia of undetermined significance.  A sample was sent for Afrima testing which revealed 50% risk of malignancy.  There were also TERT positive findings of evidence of 90% risk of malignancy.  The patient was referred to Dr. Darnell Level for thyroidectomy.  She is rescheduled to follow-up with Dr. Fransico Him on 04/02/2023.  In the interim, the patient states that she has had some difficulty swallowing some solid foods and pills.  She has not had any difficulty swallowing liquids.  She denies any pain in her neck.  She denies any  drainage or open wounds in her neck.  There is not been any worsening swelling or erythema in her neck. The patient presented because of decreased oral intake and increasing generalized weakness. BSE requested Type of Study: Bedside Swallow Evaluation Previous Swallow Assessment: none in chart Diet Prior to this Study: Regular;Thin liquids (Level 0) Temperature Spikes Noted: No Respiratory Status: Room air History of Recent Intubation: No Behavior/Cognition: Alert;Cooperative;Pleasant mood Oral Cavity Assessment: Within Functional Limits Oral Care Completed by SLP: Recent completion by staff Oral Cavity - Dentition: Adequate natural dentition Vision: Functional for self-feeding Self-Feeding Abilities: Able to feed self;Needs assist;Needs set up Patient Positioning: Upright in bed Baseline Vocal Quality: Normal    Oral/Motor/Sensory Function Overall Oral Motor/Sensory Function: Generalized oral weakness   Ice Chips Ice chips: Within functional limits   Thin Liquid Thin Liquid: Within functional limits    Nectar Thick Nectar Thick Liquid: Not tested   Honey Thick Honey Thick Liquid: Not tested   Puree Puree: Within functional limits   Solid     Solid: Impaired Presentation: Spoon;Self Fed Oral Phase Impairments: Reduced lingual movement/coordination Oral Phase Functional Implications: Impaired mastication;Oral residue      Ahron Hulbert H. Romie Levee, CCC-SLP Speech Language Pathologist   Georgetta Haber 02/27/2023,11:08 AM

## 2023-02-27 NOTE — Hospital Course (Signed)
78 year old female with a history of multinodular goiter, suspected thyroid malignancy, ovarian cancer, vitamin D deficiency, rheumatoid arthritis, hypertension presenting with generalized weakness.  The patient is a difficult historian.  History is supplemented by review of the medical record.  The patient had biopsy of her thyroid on 01/21/2023.  She followed up with her endocrinologist, Dr. Fransico Him on 02/10/2023.  At that time, the pathology from her biopsy was reviewed.  The cytology showed atypia of undetermined significance.  A sample was sent for Afrima testing which revealed 50% risk of malignancy.  There were also TERT positive findings of evidence of 90% risk of malignancy.  The patient was referred to Dr. Darnell Level for thyroidectomy.  She is rescheduled to follow-up with Dr. Fransico Him on 04/02/2023.  In the interim, the patient states that she has had some difficulty swallowing some solid foods and pills.  She has not had any difficulty swallowing liquids.  She denies any pain in her neck.  She denies any drainage or open wounds in her neck.  There is not been any worsening swelling or erythema in her neck.  She denies any fevers, chills, headache, chest pain, shortness breath, coughing, hemoptysis.  She did have 1 episode of nausea and vomiting 1 day prior to admission.  She has had some loose stools without hematochezia or melena.  She denies any abdominal pain.  There is no dysuria or hematuria. The patient presented because of decreased oral intake and increasing generalized weakness. In the ED, the patient was afebrile with soft blood pressures down to 87/45.  She was tachycardic up to 111.  WBC 17.7, hemoglobin 9.8, platelets 319.  Sodium 140, potassium 4.1, bicarbonate 22, serum creatinine 1.20.  The patient was started on IV fluids.  Blood cultures were obtained.  She was started on empiric antibiotics.

## 2023-02-27 NOTE — Progress Notes (Signed)
Patient arrived to room 320 via stretcher accompanied by caregiver.  Patient was slid from stretcher to the bed.  Patient is alert & oriented x 4.  Patient c/o being very weak.  Skin assessment reveals 2 stage 2 pressure sores to the right buttock and the left buttock has an area of shearing.  Foam dressing was applied to the buttock.  Patient has an area to her neck that is swollen.  Patient reports having a biopsy 2 weeks ago from her thyroid and is still awaiting results.  Patient denies pain at this time.

## 2023-02-27 NOTE — TOC Initial Note (Signed)
Transition of Care [facility]) - Initial/Assessment Note    Patient Details  Name: Carrie Fitzgerald MRN: [id number] Date of Birth: 1945-04-27  [facility] CM/SW Contact:    Villa Herb, LCSWA Phone Number: 02/27/2023, 12:43 PM  Clinical Narrative:                 CSW updated that PT is recommending SNF for pt at D/C. CSW met with pt at bedside to review PT recommendation. Pt states she is agreeable to SNF referral being sent out. Pt is from [place], [place] and prefers SNF in [place]. CSW to complete referral and send out for review. TOC to follow.   Expected Discharge Plan: Skilled Nursing Facility Barriers to Discharge: Continued Medical Work up   Patient Goals and CMS Choice Patient states their goals for this hospitalization and ongoing recovery are:: go to SNF CMS Medicare.gov Compare Post Acute Care list provided to:: Patient Choice offered to / list presented to : Patient      Expected Discharge Plan and Services In-house Referral: Clinical Social Work Discharge Planning Services: CM Consult Post Acute Care Choice: Skilled Nursing Facility Living arrangements for the past 2 months: Single Family Home                                      Prior Living Arrangements/Services Living arrangements for the past 2 months: Single Family Home Lives with:: Self Patient language and need for interpreter reviewed:: Yes Do you feel safe going back to the place where you live?: Yes      Need for Family Participation in Patient Care: Yes (Comment) Care giver support system in place?: Yes (comment)   Criminal Activity/Legal Involvement Pertinent to Current Situation/Hospitalization: No - Comment as needed  Activities of Daily Living      Permission Sought/Granted                  Emotional Assessment Appearance:: Appears stated age Attitude/Demeanor/Rapport: Engaged Affect (typically observed): Accepting Orientation: : Oriented to Self, Oriented to  Place, Oriented to  Time, Oriented to Situation Alcohol / Substance Use: Not Applicable Psych Involvement: No (comment)  Admission diagnosis:  Generalized weakness [R53.1] AKI (acute kidney injury) (HCC) [N17.9] Bile duct abnormality [K83.9] Leukocytosis, unspecified type [D72.829] Patient Active Problem List   Diagnosis Date Noted   Failure to thrive in adult 02/27/2023   Pressure injury of skin 02/27/2023   Hypoalbuminemia due to protein-calorie malnutrition (HCC) 02/27/2023   Leukocytosis 02/27/2023   Hypercalcemia 02/27/2023   Essential hypertension 02/27/2023   Sacral decubitus ulcer, stage II (HCC) 02/27/2023   SIRS (systemic inflammatory response syndrome) (HCC) 02/27/2023   Generalized weakness 02/26/2023   Thyroid malignant neoplasm (HCC) 02/10/2023   Multinodular goiter 02/10/2023   Diffuse goiter 11/06/2022   Mixed hyperlipidemia 11/06/2022   Degenerative joint disease (DJD) of hip 10/25/2013   Obesity, unspecified 10/25/2013   PCP:  Harley Hallmark, NP Pharmacy:   [facility] [address], [place] - [address] [place] [address] Phone: [phone] Fax: [phone]     Social Drivers of Health (SDOH) Social History: SDOH Screenings   Food Insecurity: No Food Insecurity (02/27/2023)  Housing: Unknown (02/27/2023)  Transportation Needs: No Transportation Needs (02/27/2023)  Utilities: Not At Risk (02/27/2023)  Tobacco Use: Low Risk  (02/27/2023)   SDOH Interventions:     Readmission  Risk Interventions    02/27/2023   12:39 PM  Readmission Risk Prevention Plan  Medication Screening Complete  Transportation Screening Complete

## 2023-02-27 NOTE — Progress Notes (Addendum)
PROGRESS NOTE  Carrie Fitzgerald NWG:[id number] DOB: [birth year] DOA: 02/26/2023 PCP: Harley Hallmark, NP  Brief History:  78 year old female with a history of multinodular goiter, suspected thyroid malignancy, ovarian cancer, vitamin D deficiency, rheumatoid arthritis, hypertension presenting with generalized weakness.  The patient is a difficult historian.  History is supplemented by review of the medical record.  The patient had biopsy of her thyroid on 01/21/2023.  She followed up with her endocrinologist, Dr. Fransico Him on 02/10/2023.  At that time, the pathology from her biopsy was reviewed.  The cytology showed atypia of undetermined significance.  A sample was sent for Afrima testing which revealed 50% risk of malignancy.  There were also TERT positive findings of evidence of 90% risk of malignancy.  The patient was referred to Dr. Darnell Level for thyroidectomy.  She is rescheduled to follow-up with Dr. Fransico Him on 04/02/2023.  In the interim, the patient states that she has had some difficulty swallowing some solid foods and pills.  She has not had any difficulty swallowing liquids.  She denies any pain in her neck.  She denies any drainage or open wounds in her neck.  There is not been any worsening swelling or erythema in her neck.  She denies any fevers, chills, headache, chest pain, shortness breath, coughing, hemoptysis.  She did have 1 episode of nausea and vomiting 1 day prior to admission.  She has had some loose stools without hematochezia or melena.  She denies any abdominal pain.  There is no dysuria or hematuria. The patient presented because of decreased oral intake and increasing generalized weakness. In the ED, the patient was afebrile with soft blood pressures down to 87/45.  She was tachycardic up to 111.  WBC 17.7, hemoglobin 9.8, platelets 319.  Sodium 140, potassium 4.1, bicarbonate 22, serum creatinine 1.20.  The patient was started on IV fluids.  Blood cultures were obtained.  She was  started on empiric antibiotics.   Assessment/Plan: SIRS -Patient presented with leukocytosis and tachycardia -Lactic acid 1.6 -Obtain UA and urine culture -Chest x-ray without consolidation -Check COVID-19 PCR -Follow blood cultures -Check procalcitonin -Continue empiric antibiotics pending culture data  Generalized weakness -Multifactorial including dehydration, possible infection, and possible hypothyroidism -B12 -Folic acid -TSH <0.010 -PT evaluation  AKI -baseline creatinine 0.8-0.9 -presented with serum creatinine 1.20 -continue IVF  Abnormal thyroid function studies/multinodular goiter -Check free T4 -Check free T3 -TSH <0.010 -02/26/2023 CT neck soft tissues--oropharynx and nasopharynx WNL; no retropharyngeal collection or swelling.  Normal epiglottis.  Hypopharynx and supraglottic larynx WNL.  Enlarged heterogenous multinodular thyroid with largest nodule 4.8 cm. 2.9 cm heterogeneous enhancing nodule involving the subcutaneous fat overlying the thyroid at the left lower neck  Hypercalcemia -Corrected calcium 11.7 at the time of admission -Likely related to abnormal thyroid functions and thyroid cancer -Continue IV fluids -Intact PTH -Continue IV fluids  Essential hypertension -Holding metoprolol succinate and HCTZ secondary to soft blood pressures  Mixed hyperlipidemia -Continue statin  Sacral decubitus ulcer stage II -Local wound care  Diarrhea -Check C. Difficile -Stool pathogen panel -Check COVID 19  Hypomagnesemia/Hypokalemia -replete     Family Communication:   no Family at bedside  Consultants:  none  Code Status:  FULL   DVT Prophylaxis:   [place] Lovenox   Procedures: As Listed in Progress Note Above  Antibiotics: None      Subjective:  Patient denies fevers, chills, headache, chest pain, dyspnea, nausea, vomiting, diarrhea, abdominal pain, dysuria, hematuria,  hematochezia, and melena.  Objective: Vitals:   02/26/23 2145  02/27/23 0014 02/27/23 0041 02/27/23 0503  BP: (!) 133/55 (!) 106/54 (!) 87/45 (!) 109/53  Pulse:  99 95 99  Resp:  18    Temp:  97.8 F (36.6 C) 97.6 F (36.4 C) 98.5 F (36.9 C)  TempSrc:   Oral Oral  SpO2:  95% 99%   Weight:   63.3 kg   Height:   5\' 5"  (1.651 m)     Intake/Output Summary (Last 24 hours) at 02/27/2023 0726 Last data filed at 02/27/2023 0500 Gross per 24 hour  Intake 1299.56 ml  Output 200 ml  Net 1099.56 ml   Weight change:  Exam:  General:  Pt is alert, follows commands appropriately, not in acute distress HEENT: No icterus, No thrush, No neck mass, [place]/AT Cardiovascular: RRR, S1/S2, no rubs, no gallops Respiratory: CTA bilaterally, no wheezing, no crackles, no rhonchi Abdomen: Soft/+BS, non tender, non distended, no guarding Extremities: No edema, No lymphangitis, No petechiae, No rashes, no synovitis   Data Reviewed: I have personally reviewed following labs and imaging studies Basic Metabolic Panel: Recent Labs  Lab 02/26/23 2108 02/27/23 0357  NA 140 136  K 4.1 3.4*  CL 99 104  CO2 22 22  GLUCOSE 103* 88  BUN 51* 49*  CREATININE 1.20* 0.95  CALCIUM 11.1* 9.8  MG  --  1.4*  PHOS  --  3.5   Liver Function Tests: Recent Labs  Lab 02/26/23 2108 02/27/23 0357  AST 41 29  ALT 43 30  ALKPHOS 71 53  BILITOT 1.5* 1.3*  PROT 6.4* 4.7*  ALBUMIN 3.3* 2.4*   No results for input(s): "LIPASE", "AMYLASE" in the last 168 hours. No results for input(s): "AMMONIA" in the last 168 hours. Coagulation Profile: Recent Labs  Lab 02/26/23 2109  INR 1.1   CBC: Recent Labs  Lab 02/26/23 2108  WBC 17.7*  NEUTROABS 14.1*  HGB 9.8*  HCT 31.9*  MCV 93.5  PLT 319   Cardiac Enzymes: No results for input(s): "CKTOTAL", "CKMB", "CKMBINDEX", "TROPONINI" in the last 168 hours. BNP: Invalid input(s): "POCBNP" CBG: No results for input(s): "GLUCAP" in the last 168 hours. HbA1C: No results for input(s): "HGBA1C" in the last 72 hours. Urine  analysis:    Component Value Date/Time   COLORURINE AMBER (A) 10/18/2013 1613   APPEARANCEUR CLEAR 10/18/2013 1613   LABSPEC 1.026 10/18/2013 1613   PHURINE 5.0 10/18/2013 1613   GLUCOSEU NEGATIVE 10/18/2013 1613   HGBUR NEGATIVE 10/18/2013 1613   BILIRUBINUR SMALL (A) 10/18/2013 1613   KETONESUR 15 (A) 10/18/2013 1613   PROTEINUR NEGATIVE 10/18/2013 1613   UROBILINOGEN 1.0 10/18/2013 1613   NITRITE NEGATIVE 10/18/2013 1613   LEUKOCYTESUR TRACE (A) 10/18/2013 1613   Sepsis Labs: @LABRCNTIP (procalcitonin:4,lacticidven:4) ) Recent Results (from the past 240 hours)  Blood Culture (routine x 2)     Status: None (Preliminary result)   Collection Time: 02/26/23  9:30 PM   Specimen: BLOOD  Result Value Ref Range Status   Specimen Description BLOOD BLOOD LEFT ARM  Final   Special Requests   Final    BOTTLES DRAWN AEROBIC AND ANAEROBIC Blood Culture adequate volume   Culture   Final    NO GROWTH < 12 HOURS Performed at [facility], [address], [place], [place] [address]    Report Status PENDING  Incomplete  Blood Culture (routine x 2)     Status: None (Preliminary result)   Collection Time: 02/26/23  9:31  PM   Specimen: BLOOD RIGHT HAND  Result Value Ref Range Status   Specimen Description   Final    BLOOD RIGHT HAND BOTTLES DRAWN AEROBIC AND ANAEROBIC   Special Requests Blood Culture adequate volume  Final   Culture   Final    NO GROWTH < 12 HOURS Performed at [facility], [address], [place], [place] [address]    Report Status PENDING  Incomplete     Scheduled Meds:  enoxaparin (LOVENOX) injection  40 mg Subcutaneous Q24H   feeding supplement  237 mL Oral BID BM   rosuvastatin  5 mg Oral QHS   Continuous Infusions:  lactated ringers 150 mL/hr at 02/27/23 0500    Procedures/Studies: CT Soft Tissue Neck W Contrast Result Date: 02/26/2023 CLINICAL DATA:  Initial evaluation for soft tissue infection suspected. History of thyroid cancer. EXAM: CT NECK WITH  CONTRAST TECHNIQUE: Multidetector CT imaging of the neck was performed using the standard protocol following the bolus administration of intravenous contrast. RADIATION DOSE REDUCTION: This exam was performed according to the departmental dose-optimization program which includes automated exposure control, adjustment of the mA and/or kV according to patient size and/or use of iterative reconstruction technique. CONTRAST:  OMNIPAQUE IOHEXOL 300 MG/ML  SOLN COMPARISON:  None Available. FINDINGS: Pharynx and larynx: Evaluation of the oral cavity limited by streak artifact from dental amalgam. No visible abnormality about the dentition. Oropharynx and nasopharynx within normal limits. No retropharyngeal collection or swelling. Negative epiglottis. Hypopharynx and supraglottic larynx within normal limits. Negative glottis. Subglottic airway clear. Salivary glands: Parotid and submandibular glands within normal limits. Thyroid: Markedly enlarged heterogeneous multinodular thyroid. Largest nodule on the left measures up to 4.8 cm. Heterogeneous enhancing nodular density with central hypodensity involving the subcutaneous fat overlies the thyroid at the left lower anterior neck, measuring up to 2.9 cm (series 5, image 70). Findings indeterminate, but suspected to be thyroidal in origin, possibly a nodal metastasis. Lymph nodes: No other pathologically enlarged lymph nodes within the neck. Vascular: Normal intravascular enhancement seen within the neck. Limited intracranial: Unremarkable. Visualized orbits: Prior ocular lens replacement. Otherwise unremarkable. Mastoids and visualized paranasal sinuses: Paranasal sinuses are clear. Visualized mastoids and middle ear cavities are clear as well. Skeleton: No discrete or worrisome osseous lesions. Upper chest: No other acute finding. Other: None. IMPRESSION: 1. Markedly enlarged heterogeneous multinodular thyroid, with largest nodule on the left measuring up to 4.8 cm.  Further evaluation with dedicated thyroid ultrasound recommended if not already performed. (Ref: J Am Coll Radiol. 2015 Feb;12(2): 143-50). 2. 2.9 cm heterogeneous enhancing nodule involving the subcutaneous fat overlying the thyroid at the left lower neck. Finding is indeterminate, but suspected to be thyroidal in origin, possibly a nodal metastasis. 3. No other acute abnormality within the neck. Electronically Signed   By: Rise Mu M.D.   On: 02/26/2023 22:42   CT ABDOMEN PELVIS W CONTRAST Result Date: 02/26/2023 CLINICAL DATA:  History of thyroid cancer. Weakness since biopsy 2 weeks ago. Abdominal pain. EXAM: CT ABDOMEN AND PELVIS WITH CONTRAST TECHNIQUE: Multidetector CT imaging of the abdomen and pelvis was performed using the standard protocol following bolus administration of intravenous contrast. RADIATION DOSE REDUCTION: This exam was performed according to the departmental dose-optimization program which includes automated exposure control, adjustment of the mA and/or kV according to patient size and/or use of iterative reconstruction technique. CONTRAST:  OMNIPAQUE IOHEXOL 300 MG/ML  SOLN COMPARISON:  None Available. FINDINGS: Lower chest: No acute abnormality. Hepatobiliary: No focal hepatic lesion.  Mild intra and extrahepatic biliary dilation. The common bile duct measures 12 mm in diameter. Small amount of layering sludge in the gallbladder. Pancreas: Unremarkable. Spleen: Unremarkable. Adrenals/Urinary Tract: Normal adrenal glands. No urinary calculi or hydronephrosis. The bladder is obscured by streak artifact. Stomach/Bowel: Normal caliber large and small bowel without bowel wall thickening. Stomach and appendix are within normal limits. Vascular/Lymphatic: Aortic atherosclerosis. No enlarged abdominal or pelvic lymph nodes. Reproductive: Hysterectomy. Other: No free intraperitoneal fluid or air. Musculoskeletal: Left THA.  No acute fracture. IMPRESSION: 1. No acute abnormality  in the abdomen or pelvis. 2. Mild intra and extrahepatic biliary dilation. Correlate with LFTs. If abnormal consider MRCP or ERCP for further evaluation. Aortic Atherosclerosis (ICD10-I70.0). Electronically Signed   By: Minerva Fester M.D.   On: 02/26/2023 22:29   DG Chest Port 1 View Result Date: 02/26/2023 CLINICAL DATA:  Questionable sepsis EXAM: PORTABLE CHEST 1 VIEW COMPARISON:  10/18/2013 FINDINGS: Heart is borderline in size. Mediastinal contours within normal limits. No confluent opacities, effusions or edema. No acute bony abnormality. IMPRESSION: No active disease. Electronically Signed   By: Charlett Nose M.D.   On: 02/26/2023 22:20    Catarina Hartshorn, DO  [facility]  If 7PM-7AM, please contact night-coverage [email or web address] Password [email or web address] 02/27/2023, 7:26 AM   LOS: 1 day

## 2023-02-27 NOTE — Consult Note (Addendum)
Pharmacy Antibiotic Note  ASSESSMENT: 78 y.o. female with PMH including thyroid cancer is presenting with sepsis. CTAP showing nothing acute aside from mild biliary dilation but LFTs are WNL. Other imaging negative for acute processes. Patient with WBC 17.7 and febrile. Pharmacy has been consulted to manage cefepime and vancomycin dosing.  Patient measurements: Height: 5\' 5"  (165.1 cm) Weight: 63.3 kg (139 lb 8.8 oz) IBW/kg (Calculated) : 57  Vital signs: Temp: 98.4 F (36.9 C) (01/24 0748) Temp Source: Oral (01/24 0748) BP: 106/40 (01/24 0748) Pulse Rate: 101 (01/24 0748) Recent Labs  Lab 02/26/23 2108 02/27/23 0357  WBC 17.7*  --   CREATININE 1.20* 0.95   Estimated Creatinine Clearance: 45.3 mL/min (by C-G formula based on SCr of 0.95 mg/dL).  Allergies: No Known Allergies  Antimicrobials this admission: Cefepime 1/23 >>  Vancomycin 1/23 >> Metronidazole 1/23 >>   Dose adjustments this admission: N/A  Microbiology results: 1/23 BCx: NGTD  PLAN: Initiate cefepime 2 g IV q12H Initiate vancomycin 1000 mg IV q24H eAUC 522, Cmax 34.5, Cmin 13 Scr 0.95, IBW, Vd 0.72 L/kg Follow up culture results to assess for antibiotic optimization. Monitor renal function to assess for any necessary antibiotic dosing changes.   Thank you for allowing pharmacy to be a part of this patient's care.  Will M. Dareen Piano, PharmD Clinical Pharmacist 02/27/2023 8:31 AM

## 2023-02-27 NOTE — [place] FL2 (Signed)
[place] MEDICAID FL2 LEVEL OF CARE FORM     IDENTIFICATION  Patient Name: Carrie Fitzgerald Birthdate: 1945/10/17 Sex: female Admission Date (Current Location): 02/26/2023  [facility] and [place] Number:  [employer] and Address:  [facility],  [address], [place] [address]      Provider Number: [id number]  Attending Physician Name and Address:  Catarina Hartshorn, MD  Relative Name and Phone Number:       Current Level of Care: Hospital Recommended Level of Care: Skilled Nursing Facility Prior Approval Number:    Date Approved/Denied:   PASRR Number:    Discharge Plan: SNF    Current Diagnoses: Patient Active Problem List   Diagnosis Date Noted   Failure to thrive in adult 02/27/2023   Pressure injury of skin 02/27/2023   Hypoalbuminemia due to protein-calorie malnutrition (HCC) 02/27/2023   Leukocytosis 02/27/2023   Hypercalcemia 02/27/2023   Essential hypertension 02/27/2023   Sacral decubitus ulcer, stage II (HCC) 02/27/2023   SIRS (systemic inflammatory response syndrome) (HCC) 02/27/2023   Generalized weakness 02/26/2023   Thyroid malignant neoplasm (HCC) 02/10/2023   Multinodular goiter 02/10/2023   Diffuse goiter 11/06/2022   Mixed hyperlipidemia 11/06/2022   Degenerative joint disease (DJD) of hip 10/25/2013   Obesity, unspecified 10/25/2013    Orientation RESPIRATION BLADDER Height & Weight     Self, Time, Situation, Place  Normal Incontinent Weight: 139 lb 8.8 oz (63.3 kg) Height:  5\' 5"  (165.1 cm)  BEHAVIORAL SYMPTOMS/MOOD NEUROLOGICAL BOWEL NUTRITION STATUS      Continent    AMBULATORY STATUS COMMUNICATION OF NEEDS Skin   Extensive Assist Verbally Normal (Buttocks right stage 2)                       Personal Care Assistance Level of Assistance  Bathing, Feeding, Dressing Bathing Assistance: Limited assistance Feeding assistance: Independent Dressing Assistance: Limited assistance     Functional Limitations Info   Sight, Hearing, Speech Sight Info: Adequate Hearing Info: Adequate Speech Info: Adequate    SPECIAL CARE FACTORS FREQUENCY  PT (By licensed PT), OT (By licensed OT)     PT Frequency: 5 times weekly OT Frequency: 5 times weekly            Contractures Contractures Info: Not present    Additional Factors Info  Code Status, Allergies Code Status Info: FULL Allergies Info: NKA           Current Medications (02/27/2023):  This is the current hospital active medication list Current Facility-Administered Medications  Medication Dose Route Frequency Provider Last Rate Last Admin   0.9 %  sodium chloride infusion   Intravenous Continuous Tat, David, MD 100 mL/hr at 02/27/23 0839 New Bag at 02/27/23 0839   acetaminophen (TYLENOL) tablet 650 mg  650 mg Oral Q6H PRN Adefeso, Oladapo, DO       Or   acetaminophen (TYLENOL) suppository 650 mg  650 mg Rectal Q6H PRN Adefeso, Oladapo, DO       ceFEPIme (MAXIPIME) 2 g in sodium chloride 0.9 % 100 mL IVPB  2 g Intravenous Q12H Orson Aloe, RPH 200 mL/hr at 02/27/23 1132 2 g at 02/27/23 1132   enoxaparin (LOVENOX) injection 40 mg  40 mg Subcutaneous Q24H Adefeso, Oladapo, DO   40 mg at 02/27/23 0836   feeding supplement (ENSURE ENLIVE / ENSURE PLUS) liquid 237 mL  237 mL Oral BID BM Adefeso, Oladapo, DO   237 mL at 02/27/23 0865  ondansetron (ZOFRAN) tablet 4 mg  4 mg Oral Q6H PRN Adefeso, Oladapo, DO       Or   ondansetron (ZOFRAN) injection 4 mg  4 mg Intravenous Q6H PRN Adefeso, Oladapo, DO       rosuvastatin (CRESTOR) tablet 5 mg  5 mg Oral QHS Adefeso, Oladapo, DO       vancomycin (VANCOCIN) IVPB 1000 mg/200 mL premix  1,000 mg Intravenous Q24H Orson Aloe, RPH 200 mL/hr at 02/27/23 1244 1,000 mg at 02/27/23 1244     Discharge Medications: Please see discharge summary for a list of discharge medications.  Relevant Imaging Results:  Relevant Lab Results:   Additional Information SSN: [id number] [place]

## 2023-02-28 DIAGNOSIS — C73 Malignant neoplasm of thyroid gland: Secondary | ICD-10-CM | POA: Diagnosis not present

## 2023-02-28 DIAGNOSIS — R627 Adult failure to thrive: Secondary | ICD-10-CM | POA: Diagnosis not present

## 2023-02-28 DIAGNOSIS — R531 Weakness: Secondary | ICD-10-CM | POA: Diagnosis not present

## 2023-02-28 DIAGNOSIS — R6510 Systemic inflammatory response syndrome (SIRS) of non-infectious origin without acute organ dysfunction: Secondary | ICD-10-CM | POA: Diagnosis not present

## 2023-02-28 LAB — BPAM RBC
Blood Product Expiration Date: 202502102359
Blood Product Expiration Date: 202502102359
ISSUE DATE / TIME: 202501242044
ISSUE DATE / TIME: 202501242332
Unit Type and Rh: 5100
Unit Type and Rh: 5100

## 2023-02-28 LAB — T3, FREE: T3, Free: 18.6 pg/mL — ABNORMAL HIGH (ref 2.0–4.4)

## 2023-02-28 LAB — COMPREHENSIVE METABOLIC PANEL
ALT: 33 U/L (ref 0–44)
AST: 29 U/L (ref 15–41)
Albumin: 2.5 g/dL — ABNORMAL LOW (ref 3.5–5.0)
Alkaline Phosphatase: 58 U/L (ref 38–126)
Anion gap: 10 (ref 5–15)
BUN: 33 mg/dL — ABNORMAL HIGH (ref 8–23)
CO2: 22 mmol/L (ref 22–32)
Calcium: 10.2 mg/dL (ref 8.9–10.3)
Chloride: 111 mmol/L (ref 98–111)
Creatinine, Ser: 0.61 mg/dL (ref 0.44–1.00)
GFR, Estimated: >60 mL/min (ref >60)
Glucose, Bld: 107 mg/dL — ABNORMAL HIGH (ref 70–99)
Potassium: 3.7 mmol/L (ref 3.5–5.1)
Sodium: 143 mmol/L (ref 135–145)
Total Bilirubin: 1.8 mg/dL — ABNORMAL HIGH (ref 0.0–1.2)
Total Protein: 5.2 g/dL — ABNORMAL LOW (ref 6.5–8.1)

## 2023-02-28 LAB — C DIFFICILE QUICK SCREEN W PCR REFLEX
C Diff antigen: POSITIVE — AB
C Diff toxin: NEGATIVE

## 2023-02-28 LAB — TYPE AND SCREEN
ABO/RH(D): O POS
Antibody Screen: NEGATIVE
Unit division: 0
Unit division: 0

## 2023-02-28 LAB — CBC
HCT: 32.4 % — ABNORMAL LOW (ref 36.0–46.0)
Hemoglobin: 10.1 g/dL — ABNORMAL LOW (ref 12.0–15.0)
MCH: 27.7 pg (ref 26.0–34.0)
MCHC: 31.2 g/dL (ref 30.0–36.0)
MCV: 88.8 fL (ref 80.0–100.0)
Platelets: 168 10*3/uL (ref 150–400)
RBC: 3.65 MIL/uL — ABNORMAL LOW (ref 3.87–5.11)
RDW: 14.6 % (ref 11.5–15.5)
WBC: 11.8 10*3/uL — ABNORMAL HIGH (ref 4.0–10.5)
nRBC: 0.2 % (ref 0.0–0.2)

## 2023-02-28 LAB — MAGNESIUM: Magnesium: 1.9 mg/dL (ref 1.7–2.4)

## 2023-02-28 LAB — CLOSTRIDIUM DIFFICILE BY PCR, REFLEXED: Toxigenic C. Difficile by PCR: POSITIVE — AB

## 2023-02-28 MED ORDER — GERHARDT'S BUTT CREAM
TOPICAL_CREAM | Freq: Three times a day (TID) | CUTANEOUS | Status: DC
  Administered 2023-03-02 – 2023-03-05 (×2): 1 via TOPICAL
  Filled 2023-02-28 (×2): qty 60

## 2023-02-28 MED ORDER — MAGNESIUM OXIDE -MG SUPPLEMENT 400 (240 MG) MG PO TABS
400.0000 mg | ORAL_TABLET | Freq: Every day | ORAL | Status: DC
  Administered 2023-02-28 – 2023-03-02 (×3): 400 mg via ORAL
  Filled 2023-02-28 (×3): qty 1

## 2023-02-28 MED ORDER — METOPROLOL SUCCINATE ER 25 MG PO TB24
12.5000 mg | ORAL_TABLET | Freq: Every day | ORAL | Status: DC
Start: 2023-02-28 — End: 2023-03-01
  Administered 2023-02-28 – 2023-03-01 (×2): 12.5 mg via ORAL
  Filled 2023-02-28 (×2): qty 1

## 2023-02-28 MED ORDER — PANTOPRAZOLE SODIUM 40 MG PO TBEC
40.0000 mg | DELAYED_RELEASE_TABLET | Freq: Two times a day (BID) | ORAL | Status: DC
  Administered 2023-02-28 – 2023-03-03 (×7): 40 mg via ORAL
  Filled 2023-02-28 (×7): qty 1

## 2023-02-28 NOTE — Plan of Care (Signed)

## 2023-02-28 NOTE — TOC Progression Note (Signed)
Transition of Care [facility]) - Progression Note    Patient Details  Name: Carrie Fitzgerald MRN: [id number] Date of Birth: [birth year]  [facility] of [facility]) CM/SW Contact  Catalina Gravel, [place] Phone Number: 02/28/2023, 1:47 PM  Clinical Narrative:    MD states pt potential DC ready Monday, CSW attempted PAASR- still screening- will upload clinicals if needed.  CSW attempted to start auth- pt not visible yet on [employer] portal or [employer] portal. FL2 completed 1/24. TOC will continue to follow to start Ins auth for SNF.    Expected Discharge Plan: Skilled Nursing Facility Barriers to Discharge: Continued Medical Work up  Expected Discharge Plan and Services In-house Referral: Clinical Social Work Discharge Planning Services: CM Consult Post Acute Care Choice: Skilled Nursing Facility Living arrangements for the past 2 months: Single Family Home                                       Social Determinants of Health (SDOH) Interventions SDOH Screenings   Food Insecurity: No Food Insecurity (02/27/2023)  Housing: Unknown (02/27/2023)  Transportation Needs: No Transportation Needs (02/27/2023)  Utilities: Not At Risk (02/27/2023)  Tobacco Use: Low Risk  (02/27/2023)    Readmission Risk Interventions    02/27/2023   12:39 PM  Readmission Risk Prevention Plan  Medication Screening Complete  Transportation Screening Complete

## 2023-02-28 NOTE — Progress Notes (Addendum)
PROGRESS NOTE  Carrie Fitzgerald ZOX:[id number] DOB: 1946/01/07 DOA: 02/26/2023 PCP: Harley Hallmark, NP  Brief History:  78 year old female with a history of multinodular goiter, suspected thyroid malignancy, ovarian cancer, vitamin D deficiency, rheumatoid arthritis, hypertension presenting with generalized weakness.  The patient is a difficult historian.  History is supplemented by review of the medical record.  The patient had biopsy of her thyroid on 01/21/2023.  She followed up with her endocrinologist, Dr. Fransico Him on 02/10/2023.  At that time, the pathology from her biopsy was reviewed.  The cytology showed atypia of undetermined significance.  A sample was sent for Afrima testing which revealed 50% risk of malignancy.  There were also TERT positive findings of evidence of 90% risk of malignancy.  The patient was referred to Dr. Darnell Level for thyroidectomy.  She is rescheduled to follow-up with Dr. Fransico Him on 04/02/2023.  In the interim, the patient states that she has had some difficulty swallowing some solid foods and pills.  She has not had any difficulty swallowing liquids.  She denies any pain in her neck.  She denies any drainage or open wounds in her neck.  There is not been any worsening swelling or erythema in her neck.  She denies any fevers, chills, headache, chest pain, shortness breath, coughing, hemoptysis.  She did have 1 episode of nausea and vomiting 1 day prior to admission.  She has had some loose stools without hematochezia or melena.  She denies any abdominal pain.  There is no dysuria or hematuria. The patient presented because of decreased oral intake and increasing generalized weakness. In the ED, the patient was afebrile with soft blood pressures down to 87/45.  She was tachycardic up to 111.  WBC 17.7, hemoglobin 9.8, platelets 319.  Sodium 140, potassium 4.1, bicarbonate 22, serum creatinine 1.20.  The patient was started on IV fluids.  Blood cultures were obtained.  She was  started on empiric antibiotics.   Assessment/Plan: SIRS -Patient presented with leukocytosis and tachycardia -Lactic acid 1.6 -Obtain UA 11-20 WBC -Chest x-ray without consolidation -Check COVID-19 PCR--neg -Follow blood cultures--neg to date -Check procalcitonin 0.18 -Continue empiric antibiotics pending culture data   Generalized weakness -Multifactorial including dehydration, possible infection, and possible hypothyroidism -B12--1081 -Folic acid>40 -TSH <0.010 -PT evaluation>>SNF -UA 11-20 WBC  Pyuria -continue empiric abx pending culture data   AKI -baseline creatinine 0.6-0.9 -presented with serum creatinine 1.20 -continue IVF   Hyperthyroidism/multinodular goiter -Check free T4--5.49 -Check free T3--18.6 -TSH <0.010 -02/26/2023 CT neck soft tissues--oropharynx and nasopharynx WNL; no retropharyngeal collection or swelling.  Normal epiglottis.  Hypopharynx and supraglottic larynx WNL.  Enlarged heterogenous multinodular thyroid with largest nodule 4.8 cm. 2.9 cm heterogeneous enhancing nodule involving the subcutaneous fat overlying the thyroid at the left lower neck -02/27/23--case discussed with Dr. Derwood Kaplan to start methimazole   Hypercalcemia -Corrected calcium 11.7 at the time of admission -Likely related to abnormal thyroid functions and thyroid cancer -Continue IV fluids -Intact PTH--pending -Continue IV fluids   Essential hypertension -Holding metoprolol succinate and HCTZ secondary to soft blood pressures initially -restart metoprolol succinate   Mixed hyperlipidemia -Continue statin   Sacral decubitus ulcer stage II -Local wound care   Diarrhea -Check C. Difficile antigen positive, PCR pending -Stool pathogen panel--pending -Check COVID 19--neg   Hypomagnesemia/Hypokalemia -replete   Hyperbilirubinemia -02/27/23 RUQ US--GB sludge, no biliary ductal dilatation -fractionate bili -check LDH, haptoglobin       Family Communication:   no  Family at bedside   Consultants:  none   Code Status:  FULL    DVT Prophylaxis:   [place] Lovenox     Procedures: As Listed in Progress Note Above   Antibiotics: Vanc 1/24>> Cefepime 1/24>>             Subjective: She states food is going down easier when it's chopped up.  Denies f/c, cp, sob, n/v.  Having loos stools.  Denies abd pain  Objective: Vitals:   02/28/23 0008 02/28/23 0230 02/28/23 0557 02/28/23 1344  BP: 107/67 (!) 117/51 (!) 107/51 (!) 141/81  Pulse: (!) 101 (!) 109 97 (!) 107  Resp: 17 17 20 16   Temp: 98.6 F (37 C) 98 F (36.7 C) 97.8 F (36.6 C) 98.6 F (37 C)  TempSrc: Oral Oral Oral   SpO2: 100% 99% 100% 100%  Weight:      Height:        Intake/Output Summary (Last 24 hours) at 02/28/2023 1603 Last data filed at 02/28/2023 1511 Gross per 24 hour  Intake 762 ml  Output 1000 ml  Net -238 ml   Weight change:  Exam:  General:  Pt is alert, follows commands appropriately, not in acute distress HEENT: No icterus, No thrush, No neck mass, [place]/AT Cardiovascular: RRR, S1/S2, no rubs, no gallops Respiratory: fine bibasilar crackles. No wheeze Abdomen: Soft/+BS, non tender, non distended, no guarding Extremities: No edema, No lymphangitis, No petechiae, No rashes, no synovitis   Data Reviewed: I have personally reviewed following labs and imaging studies Basic Metabolic Panel: Recent Labs  Lab 02/26/23 2108 02/27/23 0357 02/28/23 0458  NA 140 136 143  K 4.1 3.4* 3.7  CL 99 104 111  CO2 22 22 22   GLUCOSE 103* 88 107*  BUN 51* 49* 33*  CREATININE 1.20* 0.95 0.61  CALCIUM 11.1* 9.8 10.2  MG  --  1.4* 1.9  PHOS  --  3.5  --    Liver Function Tests: Recent Labs  Lab 02/26/23 2108 02/27/23 0357 02/28/23 0458  AST 41 29 29  ALT 43 30 33  ALKPHOS 71 53 58  BILITOT 1.5* 1.3* 1.8*  PROT 6.4* 4.7* 5.2*  ALBUMIN 3.3* 2.4* 2.5*   No results for input(s): "LIPASE", "AMYLASE" in the last 168 hours. No results for input(s): "AMMONIA"  in the last 168 hours. Coagulation Profile: Recent Labs  Lab 02/26/23 2109  INR 1.1   CBC: Recent Labs  Lab 02/26/23 2108 02/27/23 0701 02/28/23 0458  WBC 17.7* 11.6* 11.8*  NEUTROABS 14.1*  --   --   HGB 9.8* 7.0* 10.1*  HCT 31.9* 23.0* 32.4*  MCV 93.5 92.7 88.8  PLT 319 179 168   Cardiac Enzymes: No results for input(s): "CKTOTAL", "CKMB", "CKMBINDEX", "TROPONINI" in the last 168 hours. BNP: Invalid input(s): "POCBNP" CBG: No results for input(s): "GLUCAP" in the last 168 hours. HbA1C: No results for input(s): "HGBA1C" in the last 72 hours. Urine analysis:    Component Value Date/Time   COLORURINE YELLOW 02/27/2023 1340   APPEARANCEUR HAZY (A) 02/27/2023 1340   LABSPEC 1.032 (H) 02/27/2023 1340   PHURINE 5.0 02/27/2023 1340   GLUCOSEU NEGATIVE 02/27/2023 1340   HGBUR MODERATE (A) 02/27/2023 1340   BILIRUBINUR NEGATIVE 02/27/2023 1340   KETONESUR 5 (A) 02/27/2023 1340   PROTEINUR NEGATIVE 02/27/2023 1340   UROBILINOGEN 1.0 10/18/2013 1613   NITRITE NEGATIVE 02/27/2023 1340   LEUKOCYTESUR LARGE (A) 02/27/2023 1340   Sepsis Labs: @LABRCNTIP (procalcitonin:4,lacticidven:4) ) Recent Results (from the past 240  hours)  Blood Culture (routine x 2)     Status: None (Preliminary result)   Collection Time: 02/26/23  9:30 PM   Specimen: BLOOD  Result Value Ref Range Status   Specimen Description BLOOD BLOOD LEFT ARM  Final   Special Requests   Final    BOTTLES DRAWN AEROBIC AND ANAEROBIC Blood Culture adequate volume   Culture   Final    NO GROWTH 2 DAYS Performed at [facility], [address] [place], [place] [address]    Report Status PENDING  Incomplete  Blood Culture (routine x 2)     Status: None (Preliminary result)   Collection Time: 02/26/23  9:31 PM   Specimen: BLOOD RIGHT HAND  Result Value Ref Range Status   Specimen Description   Final    BLOOD RIGHT HAND BOTTLES DRAWN AEROBIC AND ANAEROBIC   Special Requests Blood Culture adequate volume  Final    Culture   Final    NO GROWTH 2 DAYS Performed at [facility], [address], [place], [place] [address]    Report Status PENDING  Incomplete  C Difficile Quick Screen w PCR reflex     Status: Abnormal   Collection Time: 02/27/23  6:40 AM   Specimen: STOOL  Result Value Ref Range Status   C Diff antigen POSITIVE (A) NEGATIVE Final   C Diff toxin NEGATIVE NEGATIVE Final   C Diff interpretation Results are indeterminate. See PCR results.  Final    Comment: Performed at [facility], [address], [place], [place] [address]  SARS Coronavirus 2 by RT PCR (hospital order, performed in [facility] hospital lab) *cepheid single result test* Anterior Nasal Swab     Status: None   Collection Time: 02/27/23 11:00 AM   Specimen: Anterior Nasal Swab  Result Value Ref Range Status   SARS Coronavirus 2 by RT PCR NEGATIVE NEGATIVE Final    Comment: (NOTE) SARS-CoV-2 target nucleic acids are NOT DETECTED.  The SARS-CoV-2 RNA is generally detectable in upper and lower respiratory specimens during the acute phase of infection. The lowest concentration of SARS-CoV-2 viral copies this assay can detect is 250 copies / mL. A negative result does not preclude SARS-CoV-2 infection and should not be used as the sole basis for treatment or other patient management decisions.  A negative result may occur with improper specimen collection / handling, submission of specimen other than nasopharyngeal swab, presence of viral mutation(s) within the areas targeted by this assay, and inadequate number of viral copies (<250 copies / mL). A negative result must be combined with clinical observations, patient history, and epidemiological information.  Fact Sheet for Patients:   RoadLapTop.co.za  Fact Sheet for Healthcare Providers: http://kim-miller.com/  This test is not yet approved or  cleared by the [place] FDA and has been authorized for detection and/or  diagnosis of SARS-CoV-2 by FDA under an Emergency Use Authorization (EUA).  This EUA will remain in effect (meaning this test can be used) for the duration of the COVID-19 declaration under Section 564(b)(1) of the Act, 21 U.S.C. section 360bbb-3(b)(1), unless the authorization is terminated or revoked sooner.  Performed at [facility], [address], [place], [place] [address]      Scheduled Meds:  sodium chloride   Intravenous Once   enoxaparin (LOVENOX) injection  40 mg Subcutaneous Q24H   feeding supplement  237 mL Oral BID BM   methIMAzole  5 mg Oral BID   metoprolol succinate  12.5 mg Oral Daily  rosuvastatin  5 mg Oral QHS   Continuous Infusions:  ceFEPime (MAXIPIME) IV 2 g (02/28/23 1554)   vancomycin 1,000 mg (02/28/23 1117)    Procedures/Studies: US Abdomen Limited RUQ (LIVER/GB) Result Date: 02/27/2023 CLINICAL DATA:  Hyperbilirubinemia EXAM: ULTRASOUND ABDOMEN LIMITED RIGHT UPPER QUADRANT COMPARISON:  CT 02/26/2023 FINDINGS: Gallbladder: Gallbladder is distended. There is some layering sludge. No shadowing stones. No adjacent fluid. Common bile duct: Diameter: 6 mm Liver: No focal lesion identified. Within normal limits in parenchymal echogenicity. Portal vein is patent on color Doppler imaging with normal direction of blood flow towards the liver. Other: None. IMPRESSION: No biliary dilatation.  Gallbladder sludge.  No shadowing stones. Electronically Signed   By: Karen Kays M.D.   On: 02/27/2023 14:46   CT Soft Tissue Neck W Contrast Result Date: 02/26/2023 CLINICAL DATA:  Initial evaluation for soft tissue infection suspected. History of thyroid cancer. EXAM: CT NECK WITH CONTRAST TECHNIQUE: Multidetector CT imaging of the neck was performed using the standard protocol following the bolus administration of intravenous contrast. RADIATION DOSE REDUCTION: This exam was performed according to the departmental dose-optimization program which includes automated exposure  control, adjustment of the mA and/or kV according to patient size and/or use of iterative reconstruction technique. CONTRAST:  OMNIPAQUE IOHEXOL 300 MG/ML  SOLN COMPARISON:  None Available. FINDINGS: Pharynx and larynx: Evaluation of the oral cavity limited by streak artifact from dental amalgam. No visible abnormality about the dentition. Oropharynx and nasopharynx within normal limits. No retropharyngeal collection or swelling. Negative epiglottis. Hypopharynx and supraglottic larynx within normal limits. Negative glottis. Subglottic airway clear. Salivary glands: Parotid and submandibular glands within normal limits. Thyroid: Markedly enlarged heterogeneous multinodular thyroid. Largest nodule on the left measures up to 4.8 cm. Heterogeneous enhancing nodular density with central hypodensity involving the subcutaneous fat overlies the thyroid at the left lower anterior neck, measuring up to 2.9 cm (series 5, image 70). Findings indeterminate, but suspected to be thyroidal in origin, possibly a nodal metastasis. Lymph nodes: No other pathologically enlarged lymph nodes within the neck. Vascular: Normal intravascular enhancement seen within the neck. Limited intracranial: Unremarkable. Visualized orbits: Prior ocular lens replacement. Otherwise unremarkable. Mastoids and visualized paranasal sinuses: Paranasal sinuses are clear. Visualized mastoids and middle ear cavities are clear as well. Skeleton: No discrete or worrisome osseous lesions. Upper chest: No other acute finding. Other: None. IMPRESSION: 1. Markedly enlarged heterogeneous multinodular thyroid, with largest nodule on the left measuring up to 4.8 cm. Further evaluation with dedicated thyroid ultrasound recommended if not already performed. (Ref: J Am Coll Radiol. 2015 Feb;12(2): 143-50). 2. 2.9 cm heterogeneous enhancing nodule involving the subcutaneous fat overlying the thyroid at the left lower neck. Finding is indeterminate, but suspected to  be thyroidal in origin, possibly a nodal metastasis. 3. No other acute abnormality within the neck. Electronically Signed   By: Rise Mu M.D.   On: 02/26/2023 22:42   CT ABDOMEN PELVIS W CONTRAST Result Date: 02/26/2023 CLINICAL DATA:  History of thyroid cancer. Weakness since biopsy 2 weeks ago. Abdominal pain. EXAM: CT ABDOMEN AND PELVIS WITH CONTRAST TECHNIQUE: Multidetector CT imaging of the abdomen and pelvis was performed using the standard protocol following bolus administration of intravenous contrast. RADIATION DOSE REDUCTION: This exam was performed according to the departmental dose-optimization program which includes automated exposure control, adjustment of the mA and/or kV according to patient size and/or use of iterative reconstruction technique. CONTRAST:  OMNIPAQUE IOHEXOL 300 MG/ML  SOLN COMPARISON:  None Available. FINDINGS: Lower chest:  No acute abnormality. Hepatobiliary: No focal hepatic lesion. Mild intra and extrahepatic biliary dilation. The common bile duct measures 12 mm in diameter. Small amount of layering sludge in the gallbladder. Pancreas: Unremarkable. Spleen: Unremarkable. Adrenals/Urinary Tract: Normal adrenal glands. No urinary calculi or hydronephrosis. The bladder is obscured by streak artifact. Stomach/Bowel: Normal caliber large and small bowel without bowel wall thickening. Stomach and appendix are within normal limits. Vascular/Lymphatic: Aortic atherosclerosis. No enlarged abdominal or pelvic lymph nodes. Reproductive: Hysterectomy. Other: No free intraperitoneal fluid or air. Musculoskeletal: Left THA.  No acute fracture. IMPRESSION: 1. No acute abnormality in the abdomen or pelvis. 2. Mild intra and extrahepatic biliary dilation. Correlate with LFTs. If abnormal consider MRCP or ERCP for further evaluation. Aortic Atherosclerosis (ICD10-I70.0). Electronically Signed   By: Minerva Fester M.D.   On: 02/26/2023 22:29   DG Chest Port 1 View Result  Date: 02/26/2023 CLINICAL DATA:  Questionable sepsis EXAM: PORTABLE CHEST 1 VIEW COMPARISON:  10/18/2013 FINDINGS: Heart is borderline in size. Mediastinal contours within normal limits. No confluent opacities, effusions or edema. No acute bony abnormality. IMPRESSION: No active disease. Electronically Signed   By: Charlett Nose M.D.   On: 02/26/2023 22:20    Catarina Hartshorn, DO  [facility]  If 7PM-7AM, please contact night-coverage [email or web address] Password [email or web address] 02/28/2023, 4:03 PM   LOS: 2 days

## 2023-03-01 DIAGNOSIS — A0472 Enterocolitis due to Clostridium difficile, not specified as recurrent: Secondary | ICD-10-CM | POA: Insufficient documentation

## 2023-03-01 DIAGNOSIS — A419 Sepsis, unspecified organism: Secondary | ICD-10-CM | POA: Diagnosis not present

## 2023-03-01 DIAGNOSIS — R627 Adult failure to thrive: Secondary | ICD-10-CM | POA: Diagnosis not present

## 2023-03-01 DIAGNOSIS — C73 Malignant neoplasm of thyroid gland: Secondary | ICD-10-CM | POA: Diagnosis not present

## 2023-03-01 LAB — URINE CULTURE: Culture: NO GROWTH

## 2023-03-01 LAB — GASTROINTESTINAL PANEL BY PCR, STOOL (REPLACES STOOL CULTURE)

## 2023-03-01 LAB — CBC
HCT: 30.2 % — ABNORMAL LOW (ref 36.0–46.0)
Hemoglobin: 9.4 g/dL — ABNORMAL LOW (ref 12.0–15.0)
MCH: 28 pg (ref 26.0–34.0)
MCHC: 31.1 g/dL (ref 30.0–36.0)
MCV: 89.9 fL (ref 80.0–100.0)
Platelets: 137 10*3/uL — ABNORMAL LOW (ref 150–400)
RBC: 3.36 MIL/uL — ABNORMAL LOW (ref 3.87–5.11)
RDW: 14.3 % (ref 11.5–15.5)
WBC: 12.8 10*3/uL — ABNORMAL HIGH (ref 4.0–10.5)
nRBC: 0.2 % (ref 0.0–0.2)

## 2023-03-01 LAB — COMPREHENSIVE METABOLIC PANEL
ALT: 34 U/L (ref 0–44)
AST: 29 U/L (ref 15–41)
Albumin: 2.3 g/dL — ABNORMAL LOW (ref 3.5–5.0)
Alkaline Phosphatase: 63 U/L (ref 38–126)
Anion gap: 9 (ref 5–15)
BUN: 24 mg/dL — ABNORMAL HIGH (ref 8–23)
CO2: 24 mmol/L (ref 22–32)
Calcium: 10 mg/dL (ref 8.9–10.3)
Chloride: 110 mmol/L (ref 98–111)
Creatinine, Ser: 0.55 mg/dL (ref 0.44–1.00)
GFR, Estimated: >60 mL/min (ref >60)
Glucose, Bld: 95 mg/dL (ref 70–99)
Potassium: 3.7 mmol/L (ref 3.5–5.1)
Sodium: 143 mmol/L (ref 135–145)
Total Bilirubin: 1.5 mg/dL — ABNORMAL HIGH (ref 0.0–1.2)
Total Protein: 5 g/dL — ABNORMAL LOW (ref 6.5–8.1)

## 2023-03-01 LAB — LIPASE, BLOOD: Lipase: 28 U/L (ref 11–51)

## 2023-03-01 MED ORDER — VANCOMYCIN HCL 125 MG PO CAPS
125.0000 mg | ORAL_CAPSULE | Freq: Four times a day (QID) | ORAL | Status: DC
  Administered 2023-03-01 – 2023-03-06 (×22): 125 mg via ORAL
  Filled 2023-03-01 (×22): qty 1

## 2023-03-01 MED ORDER — METOPROLOL SUCCINATE ER 25 MG PO TB24
25.0000 mg | ORAL_TABLET | Freq: Every day | ORAL | Status: DC
  Administered 2023-03-02: 25 mg via ORAL
  Filled 2023-03-01: qty 1

## 2023-03-01 NOTE — Plan of Care (Signed)

## 2023-03-01 NOTE — Progress Notes (Signed)
PROGRESS NOTE  Caoilainn Sacks ZOX:[id number] DOB: 1945/09/01 DOA: 02/26/2023 PCP: Harley Hallmark, NP  Brief History:  78 year old female with a history of multinodular goiter, suspected thyroid malignancy, ovarian cancer, vitamin D deficiency, rheumatoid arthritis, hypertension presenting with generalized weakness.  The patient is a difficult historian.  History is supplemented by review of the medical record.  The patient had biopsy of her thyroid on 01/21/2023.  She followed up with her endocrinologist, Dr. Fransico Him on 02/10/2023.  At that time, the pathology from her biopsy was reviewed.  The cytology showed atypia of undetermined significance.  A sample was sent for Afrima testing which revealed 50% risk of malignancy.  There were also TERT positive findings of evidence of 90% risk of malignancy.  The patient was referred to Dr. Darnell Level for thyroidectomy.  She is rescheduled to follow-up with Dr. Fransico Him on 04/02/2023.  In the interim, the patient states that she has had some difficulty swallowing some solid foods and pills.  She has not had any difficulty swallowing liquids.  She denies any pain in her neck.  She denies any drainage or open wounds in her neck.  There is not been any worsening swelling or erythema in her neck.  She denies any fevers, chills, headache, chest pain, shortness breath, coughing, hemoptysis.  She did have 1 episode of nausea and vomiting 1 day prior to admission.  She has had some loose stools without hematochezia or melena.  She denies any abdominal pain.  There is no dysuria or hematuria. The patient presented because of decreased oral intake and increasing generalized weakness. In the ED, the patient was afebrile with soft blood pressures down to 87/45.  She was tachycardic up to 111.  WBC 17.7, hemoglobin 9.8, platelets 319.  Sodium 140, potassium 4.1, bicarbonate 22, serum creatinine 1.20.  The patient was started on IV fluids.  Blood cultures were obtained.  She was  started on empiric antibiotics.   Assessment/Plan: Sepsis -Patient presented with leukocytosis and tachycardia -present on admission -due to Cdiff colitis -Lactic acid 1.6 -Obtain UA 11-20 WBC -Chest x-ray without consolidation -Check COVID-19 PCR--neg -Follow blood cultures--neg to date -Check procalcitonin 0.18 -Continue empiric antibiotics pending culture data>>d/c abx 1/26   Generalized weakness -Multifactorial including dehydration, possible infection, and possible hypothyroidism -B12--1081 -Folic acid>40 -TSH <0.010 -PT evaluation>>SNF -UA 11-20 WBC   Pyuria -continue empiric abx pending culture data -1/26--d/c vanc and cefepime--cultures neg  Cdiff colitis -c diff toxin neg, C diff PCR positive -present at time of admission -pt with 6-7 loose stools daily, leukocytosis and abdominal pain -start po vanco   AKI -baseline creatinine 0.6-0.9 -presented with serum creatinine 1.20 -continue IVF>>improved   Hyperthyroidism/multinodular goiter -Check free T4--5.49 -Check free T3--18.6 -TSH <0.010 -02/26/2023 CT neck soft tissues--oropharynx and nasopharynx WNL; no retropharyngeal collection or swelling.  Normal epiglottis.  Hypopharynx and supraglottic larynx WNL.  Enlarged heterogenous multinodular thyroid with largest nodule 4.8 cm. 2.9 cm heterogeneous enhancing nodule involving the subcutaneous fat overlying the thyroid at the left lower neck -02/27/23--case discussed with Dr. Derwood Kaplan to start methimazole   Hypercalcemia -Corrected calcium 11.7 at the time of admission -Likely related to abnormal thyroid functions and thyroid cancer -Continue IV fluids -Intact PTH--pending -Continue IV fluids   Essential hypertension -Holding metoprolol succinate and HCTZ secondary to soft blood pressures initially -restart metoprolol succinate>>increase to 25 mg daily   Mixed hyperlipidemia -Continue statin   Sacral decubitus ulcer stage II -Local wound care>>Gerheardt's  Butt paste  Hypomagnesemia/Hypokalemia -replete   Hyperbilirubinemia -02/27/23 RUQ US--GB sludge, no biliary ductal dilatation -fractionate bili -check LDH, haptoglobin       Family Communication:   no Family at bedside   Consultants:  none   Code Status:  FULL    DVT Prophylaxis:   [place] Lovenox     Procedures: As Listed in Progress Note Above   Antibiotics: Vanc IV 1/24>>1/26 Cefepime 1/24>>1/26 Vanco po 1/26>>                Subjective: Patient denies fevers, chills, headache, chest pain, dyspnea, nausea, vomiting, dysuria, hematuria, hematochezia, and melena.  Continues to have loose stool.  Having some abd pain today   Objective: Vitals:   02/28/23 1344 02/28/23 2024 03/01/23 0540 03/01/23 1449  BP: (!) 141/81 (!) 142/69 (!) 145/60 129/70  Pulse: (!) 107 100 96 95  Resp: 16 16 (!) 24 20  Temp: 98.6 F (37 C) (!) 97.3 F (36.3 C) 98.3 F (36.8 C) 98.4 F (36.9 C)  TempSrc:  Oral Oral Axillary  SpO2: 100% 98% 98% 100%  Weight:      Height:        Intake/Output Summary (Last 24 hours) at 03/01/2023 1729 Last data filed at 03/01/2023 1330 Gross per 24 hour  Intake 840 ml  Output --  Net 840 ml   Weight change:  Exam:  General:  Pt is alert, follows commands appropriately, not in acute distress HEENT: No icterus, No thrush, No neck mass, [place]/AT Cardiovascular: RRR, S1/S2, no rubs, no gallops Respiratory: bibasilar rales. No wheeze Abdomen: Soft/+BS, diffuse mild tender, non distended, no guarding Extremities: No edema, No lymphangitis, No petechiae, No rashes, no synovitis   Data Reviewed: I have personally reviewed following labs and imaging studies Basic Metabolic Panel: Recent Labs  Lab 02/26/23 2108 02/27/23 0357 02/28/23 0458 03/01/23 0432  NA 140 136 143 143  K 4.1 3.4* 3.7 3.7  CL 99 104 111 110  CO2 22 22 22 24   GLUCOSE 103* 88 107* 95  BUN 51* 49* 33* 24*  CREATININE 1.20* 0.95 0.61 0.55  CALCIUM 11.1* 9.8 10.2 10.0   MG  --  1.4* 1.9  --   PHOS  --  3.5  --   --    Liver Function Tests: Recent Labs  Lab 02/26/23 2108 02/27/23 0357 02/28/23 0458 03/01/23 0432  AST 41 29 29 29   ALT 43 30 33 34  ALKPHOS 71 53 58 63  BILITOT 1.5* 1.3* 1.8* 1.5*  PROT 6.4* 4.7* 5.2* 5.0*  ALBUMIN 3.3* 2.4* 2.5* 2.3*   Recent Labs  Lab 03/01/23 0432  LIPASE 28   No results for input(s): "AMMONIA" in the last 168 hours. Coagulation Profile: Recent Labs  Lab 02/26/23 2109  INR 1.1   CBC: Recent Labs  Lab 02/26/23 2108 02/27/23 0701 02/28/23 0458 03/01/23 0432  WBC 17.7* 11.6* 11.8* 12.8*  NEUTROABS 14.1*  --   --   --   HGB 9.8* 7.0* 10.1* 9.4*  HCT 31.9* 23.0* 32.4* 30.2*  MCV 93.5 92.7 88.8 89.9  PLT 319 179 168 137*   Cardiac Enzymes: No results for input(s): "CKTOTAL", "CKMB", "CKMBINDEX", "TROPONINI" in the last 168 hours. BNP: Invalid input(s): "POCBNP" CBG: No results for input(s): "GLUCAP" in the last 168 hours. HbA1C: No results for input(s): "HGBA1C" in the last 72 hours. Urine analysis:    Component Value Date/Time   COLORURINE YELLOW 02/27/2023 1340   APPEARANCEUR HAZY (A) 02/27/2023 1340  LABSPEC 1.032 (H) 02/27/2023 1340   PHURINE 5.0 02/27/2023 1340   GLUCOSEU NEGATIVE 02/27/2023 1340   HGBUR MODERATE (A) 02/27/2023 1340   BILIRUBINUR NEGATIVE 02/27/2023 1340   KETONESUR 5 (A) 02/27/2023 1340   PROTEINUR NEGATIVE 02/27/2023 1340   UROBILINOGEN 1.0 10/18/2013 1613   NITRITE NEGATIVE 02/27/2023 1340   LEUKOCYTESUR LARGE (A) 02/27/2023 1340   Sepsis Labs: @LABRCNTIP (procalcitonin:4,lacticidven:4) ) Recent Results (from the past 240 hours)  Blood Culture (routine x 2)     Status: None (Preliminary result)   Collection Time: 02/26/23  9:30 PM   Specimen: BLOOD  Result Value Ref Range Status   Specimen Description BLOOD BLOOD LEFT ARM  Final   Special Requests   Final    BOTTLES DRAWN AEROBIC AND ANAEROBIC Blood Culture adequate volume   Culture   Final    NO  GROWTH 3 DAYS Performed at [facility], [address], [place], [place] [address]    Report Status PENDING  Incomplete  Blood Culture (routine x 2)     Status: None (Preliminary result)   Collection Time: 02/26/23  9:31 PM   Specimen: BLOOD RIGHT HAND  Result Value Ref Range Status   Specimen Description   Final    BLOOD RIGHT HAND BOTTLES DRAWN AEROBIC AND ANAEROBIC   Special Requests Blood Culture adequate volume  Final   Culture   Final    NO GROWTH 3 DAYS Performed at [facility], [address], [place], [place] [address]    Report Status PENDING  Incomplete  C Difficile Quick Screen w PCR reflex     Status: Abnormal   Collection Time: 02/27/23  6:40 AM   Specimen: STOOL  Result Value Ref Range Status   C Diff antigen POSITIVE (A) NEGATIVE Final   C Diff toxin NEGATIVE NEGATIVE Final   C Diff interpretation Results are indeterminate. See PCR results.  Final    Comment: Performed at [facility], [address], [place], [place] [address]  Gastrointestinal Panel by PCR , Stool     Status: None   Collection Time: 02/27/23  6:40 AM   Specimen: STOOL  Result Value Ref Range Status   Campylobacter species NOT DETECTED NOT DETECTED Final   Plesimonas shigelloides NOT DETECTED NOT DETECTED Final   Salmonella species NOT DETECTED NOT DETECTED Final   Yersinia enterocolitica NOT DETECTED NOT DETECTED Final   Vibrio species NOT DETECTED NOT DETECTED Final   Vibrio cholerae NOT DETECTED NOT DETECTED Final   Enteroaggregative E coli (EAEC) NOT DETECTED NOT DETECTED Final   Enteropathogenic E coli (EPEC) NOT DETECTED NOT DETECTED Final   Enterotoxigenic E coli (ETEC) NOT DETECTED NOT DETECTED Final   Shiga like toxin producing E coli (STEC) NOT DETECTED NOT DETECTED Final   Shigella/Enteroinvasive E coli (EIEC) NOT DETECTED NOT DETECTED Final   Cryptosporidium NOT DETECTED NOT DETECTED Final   Cyclospora cayetanensis NOT DETECTED NOT DETECTED Final   Entamoeba histolytica NOT  DETECTED NOT DETECTED Final   Giardia lamblia NOT DETECTED NOT DETECTED Final   Adenovirus F40/41 NOT DETECTED NOT DETECTED Final   Astrovirus NOT DETECTED NOT DETECTED Final   Norovirus GI/GII NOT DETECTED NOT DETECTED Final   Rotavirus A NOT DETECTED NOT DETECTED Final   Sapovirus (I, II, IV, and V) NOT DETECTED NOT DETECTED Final    Comment: Performed at [facility], [address] [place], [place] [address]  C. Diff by PCR, Reflexed     Status: Abnormal   Collection Time: 02/27/23  6:40 AM  Result Value Ref Range Status   Toxigenic C. Difficile by PCR POSITIVE (A) NEGATIVE Final    Comment: Positive for toxigenic C. difficile with little to no toxin production. Only treat if clinical presentation suggests symptomatic illness. Performed at [facility], [address], [place], [place] [address]   SARS Coronavirus 2 by RT PCR (hospital order, performed in [facility] hospital lab) *cepheid single result test* Anterior Nasal Swab     Status: None   Collection Time: 02/27/23 11:00 AM   Specimen: Anterior Nasal Swab  Result Value Ref Range Status   SARS Coronavirus 2 by RT PCR NEGATIVE NEGATIVE Final    Comment: (NOTE) SARS-CoV-2 target nucleic acids are NOT DETECTED.  The SARS-CoV-2 RNA is generally detectable in upper and lower respiratory specimens during the acute phase of infection. The lowest concentration of SARS-CoV-2 viral copies this assay can detect is 250 copies / mL. A negative result does not preclude SARS-CoV-2 infection and should not be used as the sole basis for treatment or other patient management decisions.  A negative result may occur with improper specimen collection / handling, submission of specimen other than nasopharyngeal swab, presence of viral mutation(s) within the areas targeted by this assay, and inadequate number of viral copies (<250 copies / mL). A negative result must be combined with clinical observations, patient history, and  epidemiological information.  Fact Sheet for Patients:   RoadLapTop.co.za  Fact Sheet for Healthcare Providers: http://kim-miller.com/  This test is not yet approved or  cleared by the [place] FDA and has been authorized for detection and/or diagnosis of SARS-CoV-2 by FDA under an Emergency Use Authorization (EUA).  This EUA will remain in effect (meaning this test can be used) for the duration of the COVID-19 declaration under Section 564(b)(1) of the Act, 21 U.S.C. section 360bbb-3(b)(1), unless the authorization is terminated or revoked sooner.  Performed at [facility], [address] [place], [place] [address]   Urine Culture     Status: None   Collection Time: 02/27/23  1:40 PM   Specimen: Urine, Random  Result Value Ref Range Status   Specimen Description   Final    URINE, RANDOM Performed at [facility], [address], [place], [place] [address]    Special Requests   Final    NONE Reflexed from [id number] Performed at [facility], [address], [place], [place] [address]    Culture   Final    NO GROWTH Performed at [facility], [address], [place], [place] [address]    Report Status 03/01/2023 FINAL  Final     Scheduled Meds:  sodium chloride   Intravenous Once   enoxaparin (LOVENOX) injection  40 mg Subcutaneous Q24H   feeding supplement  237 mL Oral BID BM   Gerhardt's butt cream   Topical TID   magnesium oxide  400 mg Oral Daily   methIMAzole  5 mg Oral BID   metoprolol succinate  12.5 mg Oral Daily   pantoprazole  40 mg Oral BID   rosuvastatin  5 mg Oral QHS   vancomycin  125 mg Oral QID   Continuous Infusions:  Procedures/Studies: US Abdomen Limited RUQ (LIVER/GB) Result Date: 02/27/2023 CLINICAL DATA:  Hyperbilirubinemia EXAM: ULTRASOUND ABDOMEN LIMITED RIGHT UPPER QUADRANT COMPARISON:  CT 02/26/2023 FINDINGS: Gallbladder: Gallbladder is distended. There is some layering sludge. No  shadowing stones. No adjacent fluid. Common bile duct: Diameter: 6 mm Liver: No focal lesion identified. Within  normal limits in parenchymal echogenicity. Portal vein is patent on color Doppler imaging with normal direction of blood flow towards the liver. Other: None. IMPRESSION: No biliary dilatation.  Gallbladder sludge.  No shadowing stones. Electronically Signed   By: Karen Kays M.D.   On: 02/27/2023 14:46   CT Soft Tissue Neck W Contrast Result Date: 02/26/2023 CLINICAL DATA:  Initial evaluation for soft tissue infection suspected. History of thyroid cancer. EXAM: CT NECK WITH CONTRAST TECHNIQUE: Multidetector CT imaging of the neck was performed using the standard protocol following the bolus administration of intravenous contrast. RADIATION DOSE REDUCTION: This exam was performed according to the departmental dose-optimization program which includes automated exposure control, adjustment of the mA and/or kV according to patient size and/or use of iterative reconstruction technique. CONTRAST:  OMNIPAQUE IOHEXOL 300 MG/ML  SOLN COMPARISON:  None Available. FINDINGS: Pharynx and larynx: Evaluation of the oral cavity limited by streak artifact from dental amalgam. No visible abnormality about the dentition. Oropharynx and nasopharynx within normal limits. No retropharyngeal collection or swelling. Negative epiglottis. Hypopharynx and supraglottic larynx within normal limits. Negative glottis. Subglottic airway clear. Salivary glands: Parotid and submandibular glands within normal limits. Thyroid: Markedly enlarged heterogeneous multinodular thyroid. Largest nodule on the left measures up to 4.8 cm. Heterogeneous enhancing nodular density with central hypodensity involving the subcutaneous fat overlies the thyroid at the left lower anterior neck, measuring up to 2.9 cm (series 5, image 70). Findings indeterminate, but suspected to be thyroidal in origin, possibly a nodal metastasis. Lymph nodes: No  other pathologically enlarged lymph nodes within the neck. Vascular: Normal intravascular enhancement seen within the neck. Limited intracranial: Unremarkable. Visualized orbits: Prior ocular lens replacement. Otherwise unremarkable. Mastoids and visualized paranasal sinuses: Paranasal sinuses are clear. Visualized mastoids and middle ear cavities are clear as well. Skeleton: No discrete or worrisome osseous lesions. Upper chest: No other acute finding. Other: None. IMPRESSION: 1. Markedly enlarged heterogeneous multinodular thyroid, with largest nodule on the left measuring up to 4.8 cm. Further evaluation with dedicated thyroid ultrasound recommended if not already performed. (Ref: J Am Coll Radiol. 2015 Feb;12(2): 143-50). 2. 2.9 cm heterogeneous enhancing nodule involving the subcutaneous fat overlying the thyroid at the left lower neck. Finding is indeterminate, but suspected to be thyroidal in origin, possibly a nodal metastasis. 3. No other acute abnormality within the neck. Electronically Signed   By: Rise Mu M.D.   On: 02/26/2023 22:42   CT ABDOMEN PELVIS W CONTRAST Result Date: 02/26/2023 CLINICAL DATA:  History of thyroid cancer. Weakness since biopsy 2 weeks ago. Abdominal pain. EXAM: CT ABDOMEN AND PELVIS WITH CONTRAST TECHNIQUE: Multidetector CT imaging of the abdomen and pelvis was performed using the standard protocol following bolus administration of intravenous contrast. RADIATION DOSE REDUCTION: This exam was performed according to the departmental dose-optimization program which includes automated exposure control, adjustment of the mA and/or kV according to patient size and/or use of iterative reconstruction technique. CONTRAST:  OMNIPAQUE IOHEXOL 300 MG/ML  SOLN COMPARISON:  None Available. FINDINGS: Lower chest: No acute abnormality. Hepatobiliary: No focal hepatic lesion. Mild intra and extrahepatic biliary dilation. The common bile duct measures 12 mm in diameter. Small  amount of layering sludge in the gallbladder. Pancreas: Unremarkable. Spleen: Unremarkable. Adrenals/Urinary Tract: Normal adrenal glands. No urinary calculi or hydronephrosis. The bladder is obscured by streak artifact. Stomach/Bowel: Normal caliber large and small bowel without bowel wall thickening. Stomach and appendix are within normal limits. Vascular/Lymphatic: Aortic atherosclerosis. No enlarged abdominal or pelvic lymph nodes.  Reproductive: Hysterectomy. Other: No free intraperitoneal fluid or air. Musculoskeletal: Left THA.  No acute fracture. IMPRESSION: 1. No acute abnormality in the abdomen or pelvis. 2. Mild intra and extrahepatic biliary dilation. Correlate with LFTs. If abnormal consider MRCP or ERCP for further evaluation. Aortic Atherosclerosis (ICD10-I70.0). Electronically Signed   By: Minerva Fester M.D.   On: 02/26/2023 22:29   DG Chest Port 1 View Result Date: 02/26/2023 CLINICAL DATA:  Questionable sepsis EXAM: PORTABLE CHEST 1 VIEW COMPARISON:  10/18/2013 FINDINGS: Heart is borderline in size. Mediastinal contours within normal limits. No confluent opacities, effusions or edema. No acute bony abnormality. IMPRESSION: No active disease. Electronically Signed   By: Charlett Nose M.D.   On: 02/26/2023 22:20    Catarina Hartshorn, DO  [facility]  If 7PM-7AM, please contact night-coverage [email or web address] Password [email or web address] 03/01/2023, 5:29 PM   LOS: 3 days

## 2023-03-01 NOTE — TOC Progression Note (Addendum)
Transition of Care [facility]) - Progression Note    Patient Details  Name: Carrie Fitzgerald MRN: [id number] Date of Birth: 03-Dec-1945  Transition of Care [facility]) CM/SW Contact  Catalina [place], [place] Phone Number: 03/01/2023, 12:04 PM  Clinical Narrative:     PASSR still screening, no further direction noted.  Pt still not visible in Auth portals, CSW noted patient is [facility] Dual Complete and likely not needed an entry in portal.  TOC to follow. DC Mon to SNF.  Expected Discharge Plan: Skilled Nursing Facility Barriers to Discharge: Continued Medical Work up  Expected Discharge Plan and Services In-house Referral: Clinical Social Work Discharge Planning Services: CM Consult Post Acute Care Choice: Skilled Nursing Facility Living arrangements for the past 2 months: Single Family Home                                       Social Determinants of Health (SDOH) Interventions SDOH Screenings   Food Insecurity: No Food Insecurity (02/27/2023)  Housing: Unknown (02/27/2023)  Transportation Needs: No Transportation Needs (02/27/2023)  Utilities: Not At Risk (02/27/2023)  Social Connections: Unknown (02/28/2023)  Tobacco Use: Low Risk  (02/27/2023)    Readmission Risk Interventions    02/27/2023   12:39 PM  Readmission Risk Prevention Plan  Medication Screening Complete  Transportation Screening Complete

## 2023-03-02 DIAGNOSIS — R531 Weakness: Secondary | ICD-10-CM | POA: Diagnosis not present

## 2023-03-02 DIAGNOSIS — E042 Nontoxic multinodular goiter: Secondary | ICD-10-CM | POA: Diagnosis not present

## 2023-03-02 DIAGNOSIS — C73 Malignant neoplasm of thyroid gland: Secondary | ICD-10-CM | POA: Diagnosis not present

## 2023-03-02 DIAGNOSIS — N179 Acute kidney failure, unspecified: Secondary | ICD-10-CM | POA: Diagnosis not present

## 2023-03-02 LAB — PARATHYROID HORMONE, INTACT (NO CA): PTH: 14 pg/mL — ABNORMAL LOW (ref 15–65)

## 2023-03-02 LAB — COMPREHENSIVE METABOLIC PANEL
ALT: 48 U/L — ABNORMAL HIGH (ref 0–44)
AST: 48 U/L — ABNORMAL HIGH (ref 15–41)
Albumin: 2.2 g/dL — ABNORMAL LOW (ref 3.5–5.0)
Alkaline Phosphatase: 142 U/L — ABNORMAL HIGH (ref 38–126)
Anion gap: 8 (ref 5–15)
BUN: 16 mg/dL (ref 8–23)
CO2: 25 mmol/L (ref 22–32)
Calcium: 10.1 mg/dL (ref 8.9–10.3)
Chloride: 108 mmol/L (ref 98–111)
Creatinine, Ser: 0.56 mg/dL (ref 0.44–1.00)
GFR, Estimated: >60 mL/min (ref >60)
Glucose, Bld: 89 mg/dL (ref 70–99)
Potassium: 3.3 mmol/L — ABNORMAL LOW (ref 3.5–5.1)
Sodium: 141 mmol/L (ref 135–145)
Total Bilirubin: 1.3 mg/dL — ABNORMAL HIGH (ref 0.0–1.2)
Total Protein: 4.9 g/dL — ABNORMAL LOW (ref 6.5–8.1)

## 2023-03-02 LAB — CBC
HCT: 29.8 % — ABNORMAL LOW (ref 36.0–46.0)
Hemoglobin: 9.6 g/dL — ABNORMAL LOW (ref 12.0–15.0)
MCH: 29.2 pg (ref 26.0–34.0)
MCHC: 32.2 g/dL (ref 30.0–36.0)
MCV: 90.6 fL (ref 80.0–100.0)
Platelets: 139 10*3/uL — ABNORMAL LOW (ref 150–400)
RBC: 3.29 MIL/uL — ABNORMAL LOW (ref 3.87–5.11)
RDW: 13.6 % (ref 11.5–15.5)
WBC: 11.4 10*3/uL — ABNORMAL HIGH (ref 4.0–10.5)
nRBC: 0 % (ref 0.0–0.2)

## 2023-03-02 LAB — LACTATE DEHYDROGENASE: LDH: 126 U/L (ref 98–192)

## 2023-03-02 LAB — MAGNESIUM: Magnesium: 1.2 mg/dL — ABNORMAL LOW (ref 1.7–2.4)

## 2023-03-02 MED ORDER — MAGNESIUM SULFATE 2 GM/50ML IV SOLN
2.0000 g | Freq: Once | INTRAVENOUS | Status: AC
  Administered 2023-03-02: 2 g via INTRAVENOUS
  Filled 2023-03-02: qty 50

## 2023-03-02 MED ORDER — MAGNESIUM OXIDE -MG SUPPLEMENT 400 (240 MG) MG PO TABS
400.0000 mg | ORAL_TABLET | Freq: Two times a day (BID) | ORAL | Status: DC
  Administered 2023-03-02 – 2023-03-03 (×3): 400 mg via ORAL
  Filled 2023-03-02 (×3): qty 1

## 2023-03-02 MED ORDER — METOPROLOL TARTRATE 5 MG/5ML IV SOLN
5.0000 mg | Freq: Once | INTRAVENOUS | Status: AC
  Administered 2023-03-02: 5 mg via INTRAVENOUS
  Filled 2023-03-02: qty 5

## 2023-03-02 MED ORDER — METOPROLOL SUCCINATE ER 50 MG PO TB24
50.0000 mg | ORAL_TABLET | Freq: Every day | ORAL | Status: DC
  Administered 2023-03-03 – 2023-03-06 (×4): 50 mg via ORAL
  Filled 2023-03-02 (×4): qty 1

## 2023-03-02 MED ORDER — POTASSIUM CHLORIDE CRYS ER 20 MEQ PO TBCR
40.0000 meq | EXTENDED_RELEASE_TABLET | Freq: Once | ORAL | Status: AC
  Administered 2023-03-02: 40 meq via ORAL
  Filled 2023-03-02: qty 2

## 2023-03-02 MED ORDER — LACTATED RINGERS IV BOLUS
1000.0000 mL | Freq: Once | INTRAVENOUS | Status: AC
  Administered 2023-03-02: 1000 mL via INTRAVENOUS

## 2023-03-02 NOTE — Progress Notes (Signed)
Physical Therapy Treatment Patient Details Name: Carrie Fitzgerald MRN: [id number] DOB: July 13, 1945 Today's Date: 03/02/2023   History of Present Illness Carrie Fitzgerald is a 78 y.o. female with medical history significant of Thyroid cancer status post recent biopsy who presents to the emergency department due to concern for dehydration.  Patient states that she has been having decreased fluid and food intake since she had biopsy for the thyroid cancer about 2 weeks ago.  She has been having increasing difficulty in being able to swallow food since the biopsy was done and patient has also been having increasing weakness.  She has lost about 12 pounds since onset of symptoms per caretaker at bedside.  She had a couple of episodes of nonbloody vomiting yesterday and has also had intermittent diarrhea.  She denies fever, chest pain, shortness of breath, cough    PT Comments  Patient was in bed at start of session and was agreeable to therapy. During bed mobility patient required mod/max assist from PT to go from supine to sit. Once seated EOB patient was able to maintain balance. With use of RW patient transferred from bed to [facility] back to bed then to chair. During transfers mod assist was given for ambulation but max assist was needed for initial boost to stand. Once in chair patient performed some LE exercises AROM. Patient was limited due to fatigue. Patient left in chair with call bell at conclusion of session. Patient will benefit from continued skilled physical therapy in hospital and recommended venue below to increase strength, balance, endurance for safe ADLs and gait.      If plan is discharge home, recommend the following: A lot of help with walking and/or transfers;A lot of help with bathing/dressing/bathroom;Assistance with cooking/housework   Can travel by private vehicle     No  Equipment Recommendations  None recommended by PT    Recommendations for Other Services       Precautions  / Restrictions Precautions Precautions: Fall Restrictions Weight Bearing Restrictions Per Provider Order: No     Mobility  Bed Mobility Overal bed mobility: Needs Assistance Bed Mobility: Supine to Sit     Supine to sit: Mod assist, Max assist     General bed mobility comments: mod assist to bring trunk fully upright and to come fully to the edge of the bed Patient Response: Cooperative  Transfers Overall transfer level: Needs assistance Equipment used: Rolling walker (2 wheels) Transfers: Sit to/from Stand, Bed to chair/wheelchair/BSC Sit to Stand: Max assist, Mod assist   Step pivot transfers: Mod assist, Max assist       General transfer comment: max assist for initial boost; then mod assist to take a step over to the chair    Ambulation/Gait Ambulation/Gait assistance: Mod assist, Max assist Gait Distance (Feet): 5 Feet (side steps) Assistive device: Rolling walker (2 wheels) Gait Pattern/deviations: Trunk flexed, Decreased step length - right, Decreased step length - left, Step-to pattern Gait velocity: slow     General Gait Details: very unsteady, heaby reliance on AD   Stairs             Wheelchair Mobility     Tilt Bed Tilt Bed Patient Response: Cooperative  Modified Rankin (Stroke Patients Only)       Balance Overall balance assessment: Needs assistance Sitting-balance support: Feet supported, Bilateral upper extremity supported Sitting balance-Leahy Scale: Fair Sitting balance - Comments: fair sitting balance on edge of bed   Standing balance support: Bilateral upper extremity supported, Reliant on assistive  device for balance, During functional activity Standing balance-Leahy Scale: Fair Standing balance comment: poor to fair standing balance with RW and PT mod assist                            Cognition Arousal: Alert Behavior During Therapy: WFL for tasks assessed/performed Overall Cognitive Status: Within Functional  Limits for tasks assessed                                          Exercises General Exercises - Lower Extremity Long Arc Quad: Seated, AROM, Both, 10 reps Hip Flexion/Marching: Seated, AROM, Both, 10 reps Toe Raises: Seated, AROM, Both, 10 reps Heel Raises: Seated, AROM, Both, 10 reps    General Comments        Pertinent Vitals/Pain Pain Assessment Pain Assessment: No/denies pain    Home Living                          Prior Function            PT Goals (current goals can now be found in the care plan section) Acute Rehab PT Goals Patient Stated Goal: return home after rehab PT Goal Formulation: With patient Time For Goal Achievement: 03/13/23 Potential to Achieve Goals: Good Progress towards PT goals: Progressing toward goals    Frequency    Min 3X/week      PT Plan      Co-evaluation              AM-PAC PT "6 Clicks" Mobility   Outcome Measure  Help needed turning from your back to your side while in a flat bed without using bedrails?: A Lot Help needed moving from lying on your back to sitting on the side of a flat bed without using bedrails?: A Lot Help needed moving to and from a bed to a chair (including a wheelchair)?: A Lot Help needed standing up from a chair using your arms (e.g., wheelchair or bedside chair)?: A Lot Help needed to walk in hospital room?: A Lot Help needed climbing 3-5 steps with a railing? : Total 6 Click Score: 11    End of Session   Activity Tolerance: Patient limited by fatigue;Patient tolerated treatment well Patient left: in chair;with call bell/phone within reach Nurse Communication: Mobility status PT Visit Diagnosis: Unsteadiness on feet (R26.81);Muscle weakness (generalized) (M62.81);Other abnormalities of gait and mobility (R26.89)     Time: 1610-9604 PT Time Calculation (min) (ACUTE ONLY): 30 min  Charges:    $Therapeutic Exercise: 8-22 mins $Therapeutic Activity: 8-22  mins PT General Charges $$ ACUTE PT VISIT: 1 Visit                     Braleigh Massoud SPT

## 2023-03-02 NOTE — Progress Notes (Signed)
Afternoon rounds--   Subjective: Pt sitting up eating dinner.  Denies f/c, cp, sob, abd pain.  Vitals:   03/01/23 1449 03/01/23 2040 03/02/23 0425 03/02/23 1421  BP: 129/70 (!) 140/59 (!) 167/62 130/64  Pulse: 95 98  (!) 102  Resp: 20 18 16 17   Temp: 98.4 F (36.9 C) 98.6 F (37 C) 98.1 F (36.7 C) 98.3 F (36.8 C)  TempSrc: Axillary Oral Oral Oral  SpO2: 100% 100% 96% 98%  Weight:      Height:      VS--98.9--HR 141-RR18--120/51--98% RA CV--RRR Lung--fine bibasilar crackles.  No wheeze Abd--soft+BS/NT   Assessment/Plan: Tachycardia -?atrial tach/svt? -personally reviewed EKG--sinus vs atrial tach -in setting of low mag and low K and cdiff and hyperthyroid -give LR bolus -replete K and Mag--ordered -give lopressor IV x 1 -place on tele -if no improvement--would move to SDU and start CCB     Catarina Hartshorn, DO [facility]

## 2023-03-02 NOTE — Progress Notes (Signed)
PROGRESS NOTE  Carrie Fitzgerald KGM:[id number] DOB: [birth year] DOA: 02/26/2023 PCP: Harley Hallmark, NP  Brief History:  78 year old female with a history of multinodular goiter, suspected thyroid malignancy, ovarian cancer, vitamin D deficiency, rheumatoid arthritis, hypertension presenting with generalized weakness.  The patient is a difficult historian.  History is supplemented by review of the medical record.  The patient had biopsy of her thyroid on 01/21/2023.  She followed up with her endocrinologist, Dr. Fransico Him on 02/10/2023.  At that time, the pathology from her biopsy was reviewed.  The cytology showed atypia of undetermined significance.  A sample was sent for Afrima testing which revealed 50% risk of malignancy.  There were also TERT positive findings of evidence of 90% risk of malignancy.  The patient was referred to Dr. Darnell Level for thyroidectomy.  She is rescheduled to follow-up with Dr. Fransico Him on 04/02/2023.  In the interim, the patient states that she has had some difficulty swallowing some solid foods and pills.  She has not had any difficulty swallowing liquids.  She denies any pain in her neck.  She denies any drainage or open wounds in her neck.  There is not been any worsening swelling or erythema in her neck.  She denies any fevers, chills, headache, chest pain, shortness breath, coughing, hemoptysis.  She did have 1 episode of nausea and vomiting 1 day prior to admission.  She has had some loose stools without hematochezia or melena.  She denies any abdominal pain.  There is no dysuria or hematuria. The patient presented because of decreased oral intake and increasing generalized weakness. In the ED, the patient was afebrile with soft blood pressures down to 87/45.  She was tachycardic up to 111.  WBC 17.7, hemoglobin 9.8, platelets 319.  Sodium 140, potassium 4.1, bicarbonate 22, serum creatinine 1.20.  The patient was started on IV fluids.  Blood cultures were obtained.  She was  started on empiric antibiotics.   Assessment/Plan: Sepsis -Patient presented with leukocytosis and tachycardia -present on admission -due to Cdiff colitis -Lactic acid 1.6 -Obtain UA 11-20 WBC -Chest x-ray without consolidation -Check COVID-19 PCR--neg -Follow blood cultures--neg to date -Check procalcitonin 0.18 -Continue empiric antibiotics pending culture data>>d/c abx 1/26   Generalized weakness -Multifactorial including dehydration, possible infection, and possible hypothyroidism -B12--1081 -Folic acid>40 -TSH <0.010 -PT evaluation>>SNF -UA 11-20 WBC   Pyuria -continue empiric abx pending culture data -1/26--d/c vanc and cefepime--cultures neg  Tachycardia -obtain EKG -give fluid IV   Cdiff colitis -c diff toxin neg, C diff PCR positive -present at time of admission -pt with 6-7 loose stools daily, leukocytosis and abdominal pain -start po vanco 03/01/23   AKI -baseline creatinine 0.6-0.9 -presented with serum creatinine 1.20 -continue IVF>>improved   Hyperthyroidism/multinodular goiter -Check free T4--5.49 -Check free T3--18.6 -TSH <0.010 -02/26/2023 CT neck soft tissues--oropharynx and nasopharynx WNL; no retropharyngeal collection or swelling.  Normal epiglottis.  Hypopharynx and supraglottic larynx WNL.  Enlarged heterogenous multinodular thyroid with largest nodule 4.8 cm. 2.9 cm heterogeneous enhancing nodule involving the subcutaneous fat overlying the thyroid at the left lower neck -02/27/23--case discussed with Dr. Derwood Kaplan to start methimazole   Hypercalcemia -Corrected calcium 11.7 at the time of admission -Likely related to abnormal thyroid functions and thyroid cancer -Continue IV fluids -Intact PTH--14 -Continue IV fluids   Essential hypertension -Holding metoprolol succinate and HCTZ secondary to soft blood pressures initially -restart metoprolol succinate>>increase to 25 mg daily   Mixed hyperlipidemia -Continue statin  Sacral  decubitus ulcer stage II -Local wound care>>Gerheardt's Butt paste   Hypomagnesemia/Hypokalemia -replete   Hyperbilirubinemia -02/27/23 RUQ US--GB sludge, no biliary ductal dilatation -fractionate bili -check LDH--126 -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  Denies f/c, cp, sob, n/v.  Abd pain is improving Objective: Vitals:   03/01/23 1449 03/01/23 2040 03/02/23 0425 03/02/23 1421  BP: 129/70 (!) 140/59 (!) 167/62 130/64  Pulse: 95 98  (!) 102  Resp: 20 18 16 17   Temp: 98.4 F (36.9 C) 98.6 F (37 C) 98.1 F (36.7 C) 98.3 F (36.8 C)  TempSrc: Axillary Oral Oral Oral  SpO2: 100% 100% 96% 98%  Weight:      Height:        Intake/Output Summary (Last 24 hours) at 03/02/2023 1716 Last data filed at 03/02/2023 1300 Gross per 24 hour  Intake 600 ml  Output --  Net 600 ml   Weight change:  Exam:  General:  Pt is alert, follows commands appropriately, not in acute distress HEENT: No icterus, No thrush, No neck mass, [place]/AT Cardiovascular: RRR, S1/S2, no rubs, no gallops Respiratory: fine bibasilar crackles.  No wheeze Abdomen: Soft/+BS, non tender, non distended, no guarding Extremities: No edema, No lymphangitis, No petechiae, No rashes, no synovitis   Data Reviewed: I have personally reviewed following labs and imaging studies Basic Metabolic Panel: Recent Labs  Lab 02/26/23 2108 02/27/23 0357 02/28/23 0458 03/01/23 0432 03/02/23 0408  NA 140 136 143 143 141  K 4.1 3.4* 3.7 3.7 3.3*  CL 99 104 111 110 108  CO2 22 22 22 24 25   GLUCOSE 103* 88 107* 95 89  BUN 51* 49* 33* 24* 16  CREATININE 1.20* 0.95 0.61 0.55 0.56  CALCIUM 11.1* 9.8 10.2 10.0 10.1  MG  --   1.4* 1.9  --  1.2*  PHOS  --  3.5  --   --   --    Liver Function Tests: Recent Labs  Lab 02/26/23 2108 02/27/23 0357 02/28/23 0458 03/01/23 0432 03/02/23 0408  AST 41 29 29 29  48*  ALT 43 30 33 34 48*  ALKPHOS 71 53 58 63 142*  BILITOT 1.5* 1.3* 1.8* 1.5* 1.3*  PROT 6.4* 4.7* 5.2* 5.0* 4.9*  ALBUMIN 3.3* 2.4* 2.5* 2.3* 2.2*   Recent Labs  Lab 03/01/23 0432  LIPASE 28   No results for input(s): "AMMONIA" in the last 168 hours. Coagulation Profile: Recent Labs  Lab 02/26/23 2109  INR 1.1   CBC: Recent Labs  Lab 02/26/23 2108 02/27/23 0701 02/28/23 0458 03/01/23 0432 03/02/23 0408  WBC 17.7* 11.6* 11.8* 12.8* 11.4*  NEUTROABS 14.1*  --   --   --   --   HGB 9.8* 7.0* 10.1* 9.4* 9.6*  HCT 31.9* 23.0* 32.4* 30.2* 29.8*  MCV 93.5 92.7 88.8 89.9 90.6  PLT 319 179 168 137* 139*   Cardiac Enzymes: No results for input(s): "CKTOTAL", "CKMB", "CKMBINDEX", "TROPONINI" in the last 168 hours. BNP: Invalid input(s): "POCBNP" CBG: No results for input(s): "GLUCAP" in the last 168 hours. HbA1C: No results for input(s): "HGBA1C" in the last 72  hours. Urine analysis:    Component Value Date/Time   COLORURINE YELLOW 02/27/2023 1340   APPEARANCEUR HAZY (A) 02/27/2023 1340   LABSPEC 1.032 (H) 02/27/2023 1340   PHURINE 5.0 02/27/2023 1340   GLUCOSEU NEGATIVE 02/27/2023 1340   HGBUR MODERATE (A) 02/27/2023 1340   BILIRUBINUR NEGATIVE 02/27/2023 1340   KETONESUR 5 (A) 02/27/2023 1340   PROTEINUR NEGATIVE 02/27/2023 1340   UROBILINOGEN 1.0 10/18/2013 1613   NITRITE NEGATIVE 02/27/2023 1340   LEUKOCYTESUR LARGE (A) 02/27/2023 1340   Sepsis Labs: @LABRCNTIP (procalcitonin:4,lacticidven:4) ) Recent Results (from the past 240 hours)  Blood Culture (routine x 2)     Status: None (Preliminary result)   Collection Time: 02/26/23  9:30 PM   Specimen: BLOOD  Result Value Ref Range Status   Specimen Description BLOOD BLOOD LEFT ARM  Final   Special Requests   Final     BOTTLES DRAWN AEROBIC AND ANAEROBIC Blood Culture adequate volume   Culture   Final    NO GROWTH 4 DAYS Performed at [facility], [address], [place], [place] [address]    Report Status PENDING  Incomplete  Blood Culture (routine x 2)     Status: None (Preliminary result)   Collection Time: 02/26/23  9:31 PM   Specimen: BLOOD RIGHT HAND  Result Value Ref Range Status   Specimen Description   Final    BLOOD RIGHT HAND BOTTLES DRAWN AEROBIC AND ANAEROBIC   Special Requests Blood Culture adequate volume  Final   Culture   Final    NO GROWTH 4 DAYS Performed at [facility], [address], [place], [place] [address]    Report Status PENDING  Incomplete  C Difficile Quick Screen w PCR reflex     Status: Abnormal   Collection Time: 02/27/23  6:40 AM   Specimen: STOOL  Result Value Ref Range Status   C Diff antigen POSITIVE (A) NEGATIVE Final   C Diff toxin NEGATIVE NEGATIVE Final   C Diff interpretation Results are indeterminate. See PCR results.  Final    Comment: Performed at [facility], [address], [place], [place] [address]  Gastrointestinal Panel by PCR , Stool     Status: None   Collection Time: 02/27/23  6:40 AM   Specimen: STOOL  Result Value Ref Range Status   Campylobacter species NOT DETECTED NOT DETECTED Final   Plesimonas shigelloides NOT DETECTED NOT DETECTED Final   Salmonella species NOT DETECTED NOT DETECTED Final   Yersinia enterocolitica NOT DETECTED NOT DETECTED Final   Vibrio species NOT DETECTED NOT DETECTED Final   Vibrio cholerae NOT DETECTED NOT DETECTED Final   Enteroaggregative E coli (EAEC) NOT DETECTED NOT DETECTED Final   Enteropathogenic E coli (EPEC) NOT DETECTED NOT DETECTED Final   Enterotoxigenic E coli (ETEC) NOT DETECTED NOT DETECTED Final   Shiga like toxin producing E coli (STEC) NOT DETECTED NOT DETECTED Final   Shigella/Enteroinvasive E coli (EIEC) NOT DETECTED NOT DETECTED Final   Cryptosporidium NOT DETECTED NOT DETECTED Final    Cyclospora cayetanensis NOT DETECTED NOT DETECTED Final   Entamoeba histolytica NOT DETECTED NOT DETECTED Final   Giardia lamblia NOT DETECTED NOT DETECTED Final   Adenovirus F40/41 NOT DETECTED NOT DETECTED Final   Astrovirus NOT DETECTED NOT DETECTED Final   Norovirus GI/GII NOT DETECTED NOT DETECTED Final   Rotavirus A NOT DETECTED NOT DETECTED Final   Sapovirus (I, II, IV, and V) NOT DETECTED NOT DETECTED Final    Comment: Performed at [facility],  [address] [place], [place] [address]  C. Diff by PCR, Reflexed     Status: Abnormal   Collection Time: 02/27/23  6:40 AM  Result Value Ref Range Status   Toxigenic C. Difficile by PCR POSITIVE (A) NEGATIVE Final    Comment: Positive for toxigenic C. difficile with little to no toxin production. Only treat if clinical presentation suggests symptomatic illness. Performed at [facility], [address] [place], [place] [address]   SARS Coronavirus 2 by RT PCR (hospital order, performed in [facility] hospital lab) *cepheid single result test* Anterior Nasal Swab     Status: None   Collection Time: 02/27/23 11:00 AM   Specimen: Anterior Nasal Swab  Result Value Ref Range Status   SARS Coronavirus 2 by RT PCR NEGATIVE NEGATIVE Final    Comment: (NOTE) SARS-CoV-2 target nucleic acids are NOT DETECTED.  The SARS-CoV-2 RNA is generally detectable in upper and lower respiratory specimens during the acute phase of infection. The lowest concentration of SARS-CoV-2 viral copies this assay can detect is 250 copies / mL. A negative result does not preclude SARS-CoV-2 infection and should not be used as the sole basis for treatment or other patient management decisions.  A negative result may occur with improper specimen collection / handling, submission of specimen other than nasopharyngeal swab, presence of viral mutation(s) within the areas targeted by this assay, and inadequate number of viral copies (<250 copies / mL). A  negative result must be combined with clinical observations, patient history, and epidemiological information.  Fact Sheet for Patients:   RoadLapTop.co.za  Fact Sheet for Healthcare Providers: http://kim-miller.com/  This test is not yet approved or  cleared by the [place] FDA and has been authorized for detection and/or diagnosis of SARS-CoV-2 by FDA under an Emergency Use Authorization (EUA).  This EUA will remain in effect (meaning this test can be used) for the duration of the COVID-19 declaration under Section 564(b)(1) of the Act, 21 U.S.C. section 360bbb-3(b)(1), unless the authorization is terminated or revoked sooner.  Performed at [facility], [address] [place], [place] [address]   Urine Culture     Status: None   Collection Time: 02/27/23  1:40 PM   Specimen: Urine, Random  Result Value Ref Range Status   Specimen Description   Final    URINE, RANDOM Performed at [facility], [address] [place], [place] [address]    Special Requests   Final    NONE Reflexed from [id number] Performed at [facility], [address] [place], [place] [address]    Culture   Final    NO GROWTH Performed at [facility], [address], [place], [place] [address]    Report Status 03/01/2023 FINAL  Final     Scheduled Meds:  sodium chloride   Intravenous Once   enoxaparin (LOVENOX) injection  40 mg Subcutaneous Q24H   feeding supplement  237 mL Oral BID BM   Gerhardt's butt cream   Topical TID   magnesium oxide  400 mg Oral BID   methIMAzole  5 mg Oral BID   metoprolol succinate  25 mg Oral Daily   pantoprazole  40 mg Oral BID   potassium chloride  40 mEq Oral Once   vancomycin  125 mg Oral QID   Continuous Infusions:  magnesium sulfate bolus IVPB      Procedures/Studies: US Abdomen Limited RUQ (LIVER/GB) Result Date: 02/27/2023 CLINICAL DATA:  Hyperbilirubinemia EXAM: ULTRASOUND ABDOMEN LIMITED RIGHT  UPPER  QUADRANT COMPARISON:  CT 02/26/2023 FINDINGS: Gallbladder: Gallbladder is distended. There is some layering sludge. No shadowing stones. No adjacent fluid. Common bile duct: Diameter: 6 mm Liver: No focal lesion identified. Within normal limits in parenchymal echogenicity. Portal vein is patent on color Doppler imaging with normal direction of blood flow towards the liver. Other: None. IMPRESSION: No biliary dilatation.  Gallbladder sludge.  No shadowing stones. Electronically Signed   By: Karen Kays M.D.   On: 02/27/2023 14:46   CT Soft Tissue Neck W Contrast Result Date: 02/26/2023 CLINICAL DATA:  Initial evaluation for soft tissue infection suspected. History of thyroid cancer. EXAM: CT NECK WITH CONTRAST TECHNIQUE: Multidetector CT imaging of the neck was performed using the standard protocol following the bolus administration of intravenous contrast. RADIATION DOSE REDUCTION: This exam was performed according to the departmental dose-optimization program which includes automated exposure control, adjustment of the mA and/or kV according to patient size and/or use of iterative reconstruction technique. CONTRAST:  OMNIPAQUE IOHEXOL 300 MG/ML  SOLN COMPARISON:  None Available. FINDINGS: Pharynx and larynx: Evaluation of the oral cavity limited by streak artifact from dental amalgam. No visible abnormality about the dentition. Oropharynx and nasopharynx within normal limits. No retropharyngeal collection or swelling. Negative epiglottis. Hypopharynx and supraglottic larynx within normal limits. Negative glottis. Subglottic airway clear. Salivary glands: Parotid and submandibular glands within normal limits. Thyroid: Markedly enlarged heterogeneous multinodular thyroid. Largest nodule on the left measures up to 4.8 cm. Heterogeneous enhancing nodular density with central hypodensity involving the subcutaneous fat overlies the thyroid at the left lower anterior neck, measuring up to 2.9 cm (series 5, image  70). Findings indeterminate, but suspected to be thyroidal in origin, possibly a nodal metastasis. Lymph nodes: No other pathologically enlarged lymph nodes within the neck. Vascular: Normal intravascular enhancement seen within the neck. Limited intracranial: Unremarkable. Visualized orbits: Prior ocular lens replacement. Otherwise unremarkable. Mastoids and visualized paranasal sinuses: Paranasal sinuses are clear. Visualized mastoids and middle ear cavities are clear as well. Skeleton: No discrete or worrisome osseous lesions. Upper chest: No other acute finding. Other: None. IMPRESSION: 1. Markedly enlarged heterogeneous multinodular thyroid, with largest nodule on the left measuring up to 4.8 cm. Further evaluation with dedicated thyroid ultrasound recommended if not already performed. (Ref: J Am Coll Radiol. 2015 Feb;12(2): 143-50). 2. 2.9 cm heterogeneous enhancing nodule involving the subcutaneous fat overlying the thyroid at the left lower neck. Finding is indeterminate, but suspected to be thyroidal in origin, possibly a nodal metastasis. 3. No other acute abnormality within the neck. Electronically Signed   By: Rise Mu M.D.   On: 02/26/2023 22:42   CT ABDOMEN PELVIS W CONTRAST Result Date: 02/26/2023 CLINICAL DATA:  History of thyroid cancer. Weakness since biopsy 2 weeks ago. Abdominal pain. EXAM: CT ABDOMEN AND PELVIS WITH CONTRAST TECHNIQUE: Multidetector CT imaging of the abdomen and pelvis was performed using the standard protocol following bolus administration of intravenous contrast. RADIATION DOSE REDUCTION: This exam was performed according to the departmental dose-optimization program which includes automated exposure control, adjustment of the mA and/or kV according to patient size and/or use of iterative reconstruction technique. CONTRAST:  OMNIPAQUE IOHEXOL 300 MG/ML  SOLN COMPARISON:  None Available. FINDINGS: Lower chest: No acute abnormality. Hepatobiliary: No focal  hepatic lesion. Mild intra and extrahepatic biliary dilation. The common bile duct measures 12 mm in diameter. Small amount of layering sludge in the gallbladder. Pancreas: Unremarkable. Spleen: Unremarkable. Adrenals/Urinary Tract: Normal adrenal glands. No urinary calculi or hydronephrosis.  The bladder is obscured by streak artifact. Stomach/Bowel: Normal caliber large and small bowel without bowel wall thickening. Stomach and appendix are within normal limits. Vascular/Lymphatic: Aortic atherosclerosis. No enlarged abdominal or pelvic lymph nodes. Reproductive: Hysterectomy. Other: No free intraperitoneal fluid or air. Musculoskeletal: Left THA.  No acute fracture. IMPRESSION: 1. No acute abnormality in the abdomen or pelvis. 2. Mild intra and extrahepatic biliary dilation. Correlate with LFTs. If abnormal consider MRCP or ERCP for further evaluation. Aortic Atherosclerosis (ICD10-I70.0). Electronically Signed   By: Minerva Fester M.D.   On: 02/26/2023 22:29   DG Chest Port 1 View Result Date: 02/26/2023 CLINICAL DATA:  Questionable sepsis EXAM: PORTABLE CHEST 1 VIEW COMPARISON:  10/18/2013 FINDINGS: Heart is borderline in size. Mediastinal contours within normal limits. No confluent opacities, effusions or edema. No acute bony abnormality. IMPRESSION: No active disease. Electronically Signed   By: Charlett Nose M.D.   On: 02/26/2023 22:20    Catarina Hartshorn, DO  [facility]  If 7PM-7AM, please contact night-coverage [email or web address] Password [facility] 03/02/2023, 5:16 PM   LOS: 4 days

## 2023-03-02 NOTE — Plan of Care (Signed)
Problem: Clinical Measurements: Goal: Ability to maintain clinical measurements within normal limits will improve Outcome: Progressing Goal: Will remain free from infection Outcome: Progressing Goal: Diagnostic test results will improve Outcome: Progressing   Problem: Safety: Goal: Ability to remain free from injury will improve Outcome: Progressing   Problem: Skin Integrity: Goal: Risk for impaired skin integrity will decrease Outcome: Progressing

## 2023-03-02 NOTE — TOC Progression Note (Addendum)
Transition of Care [facility]) - Progression Note    Patient Details  Name: Carrie Fitzgerald MRN: [id number] Date of Birth: May 21, 1945  Transition of Care [facility]) CM/SW Contact  [place], [place] Phone Number: 03/02/2023, 3:08 PM  Clinical Narrative:     CSW spoke with pt multiple times about SNF placement. Pt stated that she wanted to think about the two bed offers and for CSW to speak with her later. CSW called and spoke with pt this afternoon for decision. Pt states she is going to speak with her son about it tomorrow. CSW updated pt that she is medically stable and we need a decision made so that insurance auth can be started by the facility she chooses. Pt told CSW to call back tomorrow. CSW explained that a decision will need to be made tomorrow. CSW to update MD.   CSW spoke with pts son to review bed offers, he will speak with pt tonight and they will make a decision. TOC to follow.   Expected Discharge Plan: Skilled Nursing Facility Barriers to Discharge: Continued Medical Work up  Expected Discharge Plan and Services In-house Referral: Clinical Social Work Discharge Planning Services: CM Consult Post Acute Care Choice: Skilled Nursing Facility Living arrangements for the past 2 months: Single Family Home                                       Social Determinants of Health (SDOH) Interventions SDOH Screenings   Food Insecurity: No Food Insecurity (02/27/2023)  Housing: Unknown (02/27/2023)  Transportation Needs: No Transportation Needs (02/27/2023)  Utilities: Not At Risk (02/27/2023)  Social Connections: Unknown (02/28/2023)  Tobacco Use: Low Risk  (02/27/2023)    Readmission Risk Interventions    02/27/2023   12:39 PM  Readmission Risk Prevention Plan  Medication Screening Complete  Transportation Screening Complete

## 2023-03-03 ENCOUNTER — Other Ambulatory Visit (HOSPITAL_COMMUNITY): Payer: Self-pay

## 2023-03-03 ENCOUNTER — Telehealth (HOSPITAL_COMMUNITY): Payer: Self-pay | Admitting: Pharmacy Technician

## 2023-03-03 DIAGNOSIS — A0472 Enterocolitis due to Clostridium difficile, not specified as recurrent: Secondary | ICD-10-CM | POA: Diagnosis not present

## 2023-03-03 DIAGNOSIS — N179 Acute kidney failure, unspecified: Secondary | ICD-10-CM | POA: Diagnosis not present

## 2023-03-03 DIAGNOSIS — R531 Weakness: Secondary | ICD-10-CM | POA: Diagnosis not present

## 2023-03-03 DIAGNOSIS — A419 Sepsis, unspecified organism: Secondary | ICD-10-CM | POA: Diagnosis not present

## 2023-03-03 LAB — COMPREHENSIVE METABOLIC PANEL
ALT: 47 U/L — ABNORMAL HIGH (ref 0–44)
AST: 42 U/L — ABNORMAL HIGH (ref 15–41)
Albumin: 2.2 g/dL — ABNORMAL LOW (ref 3.5–5.0)
Alkaline Phosphatase: 119 U/L (ref 38–126)
Anion gap: 8 (ref 5–15)
BUN: 14 mg/dL (ref 8–23)
CO2: 23 mmol/L (ref 22–32)
Calcium: 10.2 mg/dL (ref 8.9–10.3)
Chloride: 107 mmol/L (ref 98–111)
Creatinine, Ser: 0.58 mg/dL (ref 0.44–1.00)
GFR, Estimated: >60 mL/min (ref >60)
Glucose, Bld: 97 mg/dL (ref 70–99)
Potassium: 3.8 mmol/L (ref 3.5–5.1)
Sodium: 138 mmol/L (ref 135–145)
Total Bilirubin: 1.1 mg/dL (ref 0.0–1.2)
Total Protein: 5.2 g/dL — ABNORMAL LOW (ref 6.5–8.1)

## 2023-03-03 LAB — CBC
HCT: 30.5 % — ABNORMAL LOW (ref 36.0–46.0)
Hemoglobin: 9.7 g/dL — ABNORMAL LOW (ref 12.0–15.0)
MCH: 28.2 pg (ref 26.0–34.0)
MCHC: 31.8 g/dL (ref 30.0–36.0)
MCV: 88.7 fL (ref 80.0–100.0)
Platelets: 139 10*3/uL — ABNORMAL LOW (ref 150–400)
RBC: 3.44 MIL/uL — ABNORMAL LOW (ref 3.87–5.11)
RDW: 13.2 % (ref 11.5–15.5)
WBC: 13 10*3/uL — ABNORMAL HIGH (ref 4.0–10.5)
nRBC: 0 % (ref 0.0–0.2)

## 2023-03-03 LAB — CULTURE, BLOOD (ROUTINE X 2)
Culture: NO GROWTH
Culture: NO GROWTH
Special Requests: ADEQUATE
Special Requests: ADEQUATE

## 2023-03-03 LAB — HAPTOGLOBIN: Haptoglobin: 189 mg/dL (ref 42–346)

## 2023-03-03 MED ORDER — LACTATED RINGERS IV BOLUS
1000.0000 mL | Freq: Once | INTRAVENOUS | Status: AC
  Administered 2023-03-03: 1000 mL via INTRAVENOUS

## 2023-03-03 NOTE — Progress Notes (Signed)
Mobility Specialist Progress Note:    03/03/23 1405  Mobility  Activity Dangled on edge of bed  Level of Assistance Moderate assist, patient does 50-74%  Assistive Device None  Distance Ambulated (ft) 0 ft  Range of Motion/Exercises Active;All extremities  Activity Response Tolerated well  Mobility Referral Yes  Mobility visit 1 Mobility  Mobility Specialist Start Time (ACUTE ONLY) 1405  Mobility Specialist Stop Time (ACUTE ONLY) 1440  Mobility Specialist Time Calculation (min) (ACUTE ONLY) 35 min   Pt received in bed, agreeable to mobility. Required ModA for supine to sit. Independently able to perform bilateral leg kicks 2x10. Tolerated well, asx throughout. Returned supine, alarm on. All needs met.  Najai Waszak Mobility Specialist Please contact via Special educational needs teacher or  Rehab office at [phone]

## 2023-03-03 NOTE — Telephone Encounter (Signed)
Patient Product/process development scientist completed.    The patient is insured through [facility]. Patient has Medicare and is not eligible for a copay card, but may be able to apply for patient assistance or Medicare RX Payment Plan (Patient Must reach out to their plan, if eligible for payment plan), if available.    Ran test claim for vancomycin 125 mg  and the current 10 day co-pay is $0.00.  Ran test claim for Dificid 200 mg  and the current 10 day co-pay is $0.00.  This test claim was processed through [facility]- copay amounts may vary at other pharmacies due to pharmacy/plan contracts, or as the patient moves through the different stages of their insurance plan.     Roland Earl, CPHT Pharmacy Technician III Certified Patient Advocate [facility] Patient Advocate Team Direct Number: [phone]  Fax: [phone]

## 2023-03-03 NOTE — TOC Progression Note (Addendum)
Transition of Care [facility]) - Progression Note    Patient Details  Name: Djuana Littleton MRN: [id number] Date of Birth: 07/22/1945  Transition of Care [facility]) CM/SW Contact  Villa Herb, [place] Phone Number: 03/03/2023, 10:50 AM  Clinical Narrative:    CSW spoke with pt to review bed offers. Pt accepts bed at [facility]. CSW reached out to [facility] and requested they start auth. CSW spoke to [place] in admissions who states they will get pts insurance auth started at this time. TOC to follow.   Expected Discharge Plan: Skilled Nursing Facility Barriers to Discharge: Continued Medical Work up  Expected Discharge Plan and Services In-house Referral: Clinical Social Work Discharge Planning Services: CM Consult Post Acute Care Choice: Skilled Nursing Facility Living arrangements for the past 2 months: Single Family Home                                       Social Determinants of Health (SDOH) Interventions SDOH Screenings   Food Insecurity: No Food Insecurity (02/27/2023)  Housing: Unknown (02/27/2023)  Transportation Needs: No Transportation Needs (02/27/2023)  Utilities: Not At Risk (02/27/2023)  Social Connections: Unknown (02/28/2023)  Tobacco Use: Low Risk  (02/27/2023)    Readmission Risk Interventions    02/27/2023   12:39 PM  Readmission Risk Prevention Plan  Medication Screening Complete  Transportation Screening Complete

## 2023-03-03 NOTE — Progress Notes (Signed)
PROGRESS NOTE  Carrie Fitzgerald ZOX:[id number] DOB: [birth year] DOA: 02/26/2023 PCP: Harley Hallmark, NP  Brief History:  78 year old female with a history of multinodular goiter, suspected thyroid malignancy, ovarian cancer, vitamin D deficiency, rheumatoid arthritis, hypertension presenting with generalized weakness.  The patient is a difficult historian.  History is supplemented by review of the medical record.  The patient had biopsy of her thyroid on 01/21/2023.  She followed up with her endocrinologist, Dr. Fransico Him on 02/10/2023.  At that time, the pathology from her biopsy was reviewed.  The cytology showed atypia of undetermined significance.  A sample was sent for Afrima testing which revealed 50% risk of malignancy.  There were also TERT positive findings of evidence of 90% risk of malignancy.  The patient was referred to Dr. Darnell Level for thyroidectomy.  She is rescheduled to follow-up with Dr. Fransico Him on 04/02/2023.  In the interim, the patient states that she has had some difficulty swallowing some solid foods and pills.  She has not had any difficulty swallowing liquids.  She denies any pain in her neck.  She denies any drainage or open wounds in her neck.  There is not been any worsening swelling or erythema in her neck.  She denies any fevers, chills, headache, chest pain, shortness breath, coughing, hemoptysis.  She did have 1 episode of nausea and vomiting 1 day prior to admission.  She has had some loose stools without hematochezia or melena.  She denies any abdominal pain.  There is no dysuria or hematuria. The patient presented because of decreased oral intake and increasing generalized weakness. In the ED, the patient was afebrile with soft blood pressures down to 87/45.  She was tachycardic up to 111.  WBC 17.7, hemoglobin 9.8, platelets 319.  Sodium 140, potassium 4.1, bicarbonate 22, serum creatinine 1.20.  The patient was started on IV fluids.  Blood cultures were obtained.  She was  started on empiric antibiotics.   Assessment/Plan: Sepsis -Patient presented with leukocytosis and tachycardia -present on admission -due to Cdiff colitis -Lactic acid 1.6 -Obtain UA 11-20 WBC -Chest x-ray without consolidation -Check COVID-19 PCR--neg -Follow blood cultures--neg to date -Check procalcitonin 0.18 -Continue empiric antibiotics pending culture data>>d/c abx 1/26   Generalized weakness -Multifactorial including dehydration, possible infection, and possible hypothyroidism -B12--1081 -Folic acid>40 -TSH <0.010 -PT evaluation>>SNF -UA 11-20 WBC   Pyuria -continue empiric abx pending culture data -1/26--d/c vanc and cefepime--cultures neg   Tachycardia/SVT -personally reviewed EKG--fluid bolus and lopressor IV given>>improved -increase metoprolol succinate dose to 50 mg   Cdiff colitis -c diff toxin neg, C diff PCR positive -present at time of admission -pt with 6-7 loose stools daily, leukocytosis and abdominal pain -start po vanco 03/01/23   AKI -baseline creatinine 0.6-0.9 -presented with serum creatinine 1.20 -continue IVF>>improved   Hyperthyroidism/multinodular goiter -Check free T4--5.49 -Check free T3--18.6 -TSH <0.010 -02/26/2023 CT neck soft tissues--oropharynx and nasopharynx WNL; no retropharyngeal collection or swelling.  Normal epiglottis.  Hypopharynx and supraglottic larynx WNL.  Enlarged heterogenous multinodular thyroid with largest nodule 4.8 cm. 2.9 cm heterogeneous enhancing nodule involving the subcutaneous fat overlying the thyroid at the left lower neck -02/27/23--case discussed with Dr. Derwood Kaplan to start methimazole   Hypercalcemia -Corrected calcium 11.7 at the time of admission -Likely related to abnormal thyroid functions and thyroid cancer -Continue IV fluids -Intact PTH--14 -Continue IV fluids   Essential hypertension -Holding metoprolol succinate and HCTZ secondary to soft blood pressures initially -restart metoprolol  succinate>>increase to 50 mg daily   Mixed hyperlipidemia -Continue statin   Sacral decubitus ulcer stage II -Local wound care>>Gerheardt's Butt paste   Hypomagnesemia/Hypokalemia -replete   Hyperbilirubinemia -02/27/23 RUQ US--GB sludge, no biliary ductal dilatation -fractionate bili -check LDH--126 -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   Objective: Vitals:   03/02/23 1421 03/02/23 2144 03/03/23 0525 03/03/23 1430  BP: 130/64 (!) 138/58 131/74 120/68  Pulse: (!) 102 (!) 103 98 96  Resp: 17 16 16 17   Temp: 98.3 F (36.8 C) 98.8 F (37.1 C) 97.6 F (36.4 C) 97.9 F (36.6 C)  TempSrc: Oral  Oral Oral  SpO2: 98% 100%  99%  Weight:      Height:        Intake/Output Summary (Last 24 hours) at 03/03/2023 1721 Last data filed at 03/03/2023 1300 Gross per 24 hour  Intake 240 ml  Output 650 ml  Net -410 ml   Weight change:  Exam:  General:  Pt is alert, follows commands appropriately, not in acute distress HEENT: No icterus, No thrush, No neck mass, [place]/AT Cardiovascular: RRR, S1/S2, no rubs, no gallops Respiratory: bibasilar rales.  No wheeze Abdomen: Soft/+BS, non tender, non distended, no guarding Extremities: 1 + RLE edema, No lymphangitis, No petechiae, No rashes, no synovitis   Data Reviewed: I have personally reviewed following labs and imaging studies Basic Metabolic Panel: Recent Labs  Lab 02/27/23 0357 02/28/23 0458 03/01/23 0432 03/02/23 0408 03/03/23 0525  NA 136 143 143 141 138  K 3.4* 3.7  3.7 3.3* 3.8  CL 104 111 110 108 107  CO2 22 22 24 25 23   GLUCOSE 88 107* 95 89 97  BUN 49* 33* 24* 16 14  CREATININE 0.95 0.61 0.55 0.56 0.58  CALCIUM 9.8 10.2 10.0 10.1 10.2  MG 1.4* 1.9  --  1.2*  --   PHOS 3.5  --   --   --   --    Liver Function Tests: Recent Labs  Lab 02/27/23 0357 02/28/23 0458 03/01/23 0432 03/02/23 0408 03/03/23 0525  AST 29 29 29  48* 42*  ALT 30 33 34 48* 47*  ALKPHOS 53 58 63 142* 119  BILITOT 1.3* 1.8* 1.5* 1.3* 1.1  PROT 4.7* 5.2* 5.0* 4.9* 5.2*  ALBUMIN 2.4* 2.5* 2.3* 2.2* 2.2*   Recent Labs  Lab 03/01/23 0432  LIPASE 28   No results for input(s): "AMMONIA" in the last 168 hours. Coagulation Profile: Recent Labs  Lab 02/26/23 2109  INR 1.1   CBC: Recent Labs  Lab 02/26/23 2108 02/27/23 0701 02/28/23 0458 03/01/23 0432 03/02/23 0408 03/03/23 0525  WBC 17.7* 11.6* 11.8* 12.8* 11.4* 13.0*  NEUTROABS 14.1*  --   --   --   --   --   HGB 9.8* 7.0* 10.1* 9.4* 9.6* 9.7*  HCT 31.9* 23.0* 32.4* 30.2* 29.8* 30.5*  MCV 93.5 92.7 88.8 89.9 90.6 88.7  PLT 319 179 168 137* 139* 139*   Cardiac Enzymes: No results for  input(s): "CKTOTAL", "CKMB", "CKMBINDEX", "TROPONINI" in the last 168 hours. BNP: Invalid input(s): "POCBNP" CBG: No results for input(s): "GLUCAP" in the last 168 hours. HbA1C: No results for input(s): "HGBA1C" in the last 72 hours. Urine analysis:    Component Value Date/Time   COLORURINE YELLOW 02/27/2023 1340   APPEARANCEUR HAZY (A) 02/27/2023 1340   LABSPEC 1.032 (H) 02/27/2023 1340   PHURINE 5.0 02/27/2023 1340   GLUCOSEU NEGATIVE 02/27/2023 1340   HGBUR MODERATE (A) 02/27/2023 1340   BILIRUBINUR NEGATIVE 02/27/2023 1340   KETONESUR 5 (A) 02/27/2023 1340   PROTEINUR NEGATIVE 02/27/2023 1340   UROBILINOGEN 1.0 10/18/2013 1613   NITRITE NEGATIVE 02/27/2023 1340   LEUKOCYTESUR LARGE (A) 02/27/2023 1340   Sepsis Labs: @LABRCNTIP (procalcitonin:4,lacticidven:4) ) Recent Results (from the past 240 hours)   Blood Culture (routine x 2)     Status: None   Collection Time: 02/26/23  9:30 PM   Specimen: BLOOD  Result Value Ref Range Status   Specimen Description BLOOD BLOOD LEFT ARM  Final   Special Requests   Final    BOTTLES DRAWN AEROBIC AND ANAEROBIC Blood Culture adequate volume   Culture   Final    NO GROWTH 5 DAYS Performed at [facility], [address], [place], [place] [address]    Report Status 03/03/2023 FINAL  Final  Blood Culture (routine x 2)     Status: None   Collection Time: 02/26/23  9:31 PM   Specimen: BLOOD RIGHT HAND  Result Value Ref Range Status   Specimen Description   Final    BLOOD RIGHT HAND BOTTLES DRAWN AEROBIC AND ANAEROBIC   Special Requests Blood Culture adequate volume  Final   Culture   Final    NO GROWTH 5 DAYS Performed at [facility], [address] [place], [place] [address]    Report Status 03/03/2023 FINAL  Final  C Difficile Quick Screen w PCR reflex     Status: Abnormal   Collection Time: 02/27/23  6:40 AM   Specimen: STOOL  Result Value Ref Range Status   C Diff antigen POSITIVE (A) NEGATIVE Final   C Diff toxin NEGATIVE NEGATIVE Final   C Diff interpretation Results are indeterminate. See PCR results.  Final    Comment: Performed at [facility], [address], [place], [place] [address]  Gastrointestinal Panel by PCR , Stool     Status: None   Collection Time: 02/27/23  6:40 AM   Specimen: STOOL  Result Value Ref Range Status   Campylobacter species NOT DETECTED NOT DETECTED Final   Plesimonas shigelloides NOT DETECTED NOT DETECTED Final   Salmonella species NOT DETECTED NOT DETECTED Final   Yersinia enterocolitica NOT DETECTED NOT DETECTED Final   Vibrio species NOT DETECTED NOT DETECTED Final   Vibrio cholerae NOT DETECTED NOT DETECTED Final   Enteroaggregative E coli (EAEC) NOT DETECTED NOT DETECTED Final   Enteropathogenic E coli (EPEC) NOT DETECTED NOT DETECTED Final   Enterotoxigenic E coli (ETEC) NOT DETECTED NOT DETECTED  Final   Shiga like toxin producing E coli (STEC) NOT DETECTED NOT DETECTED Final   Shigella/Enteroinvasive E coli (EIEC) NOT DETECTED NOT DETECTED Final   Cryptosporidium NOT DETECTED NOT DETECTED Final   Cyclospora cayetanensis NOT DETECTED NOT DETECTED Final   Entamoeba histolytica NOT DETECTED NOT DETECTED Final   Giardia lamblia NOT DETECTED NOT DETECTED Final   Adenovirus F40/41 NOT DETECTED NOT DETECTED Final   Astrovirus NOT DETECTED NOT DETECTED Final   Norovirus GI/GII NOT DETECTED NOT  DETECTED Final   Rotavirus A NOT DETECTED NOT DETECTED Final   Sapovirus (I, II, IV, and V) NOT DETECTED NOT DETECTED Final    Comment: Performed at [facility], [address] [place], [place] [address]  C. Diff by PCR, Reflexed     Status: Abnormal   Collection Time: 02/27/23  6:40 AM  Result Value Ref Range Status   Toxigenic C. Difficile by PCR POSITIVE (A) NEGATIVE Final    Comment: Positive for toxigenic C. difficile with little to no toxin production. Only treat if clinical presentation suggests symptomatic illness. Performed at [facility], [address] [place], [place] [address]   SARS Coronavirus 2 by RT PCR (hospital order, performed in [facility] hospital lab) *cepheid single result test* Anterior Nasal Swab     Status: None   Collection Time: 02/27/23 11:00 AM   Specimen: Anterior Nasal Swab  Result Value Ref Range Status   SARS Coronavirus 2 by RT PCR NEGATIVE NEGATIVE Final    Comment: (NOTE) SARS-CoV-2 target nucleic acids are NOT DETECTED.  The SARS-CoV-2 RNA is generally detectable in upper and lower respiratory specimens during the acute phase of infection. The lowest concentration of SARS-CoV-2 viral copies this assay can detect is 250 copies / mL. A negative result does not preclude SARS-CoV-2 infection and should not be used as the sole basis for treatment or other patient management decisions.  A negative result may occur with improper specimen  collection / handling, submission of specimen other than nasopharyngeal swab, presence of viral mutation(s) within the areas targeted by this assay, and inadequate number of viral copies (<250 copies / mL). A negative result must be combined with clinical observations, patient history, and epidemiological information.  Fact Sheet for Patients:   RoadLapTop.co.za  Fact Sheet for Healthcare Providers: http://kim-miller.com/  This test is not yet approved or  cleared by the [place] FDA and has been authorized for detection and/or diagnosis of SARS-CoV-2 by FDA under an Emergency Use Authorization (EUA).  This EUA will remain in effect (meaning this test can be used) for the duration of the COVID-19 declaration under Section 564(b)(1) of the Act, 21 U.S.C. section 360bbb-3(b)(1), unless the authorization is terminated or revoked sooner.  Performed at [facility], [address], [place], [place] [address]   Urine Culture     Status: None   Collection Time: 02/27/23  1:40 PM   Specimen: Urine, Random  Result Value Ref Range Status   Specimen Description   Final    URINE, RANDOM Performed at [facility], [address], [place], [place] [address]    Special Requests   Final    NONE Reflexed from [id number] Performed at [facility], [address], [place], [place] [address]    Culture   Final    NO GROWTH Performed at [facility], [address] [place], [place] [address]    Report Status 03/01/2023 FINAL  Final     Scheduled Meds:  sodium chloride   Intravenous Once   enoxaparin (LOVENOX) injection  40 mg Subcutaneous Q24H   feeding supplement  237 mL Oral BID BM   Gerhardt's butt cream   Topical TID   magnesium oxide  400 mg Oral BID   methIMAzole  5 mg Oral BID   metoprolol succinate  50 mg Oral Daily   pantoprazole  40 mg Oral BID   vancomycin  125 mg Oral QID   Continuous Infusions:  lactated ringers  Procedures/Studies: US Abdomen Limited RUQ (LIVER/GB) Result Date: 02/27/2023 CLINICAL DATA:  Hyperbilirubinemia EXAM: ULTRASOUND ABDOMEN LIMITED RIGHT UPPER QUADRANT COMPARISON:  CT 02/26/2023 FINDINGS: Gallbladder: Gallbladder is distended. There is some layering sludge. No shadowing stones. No adjacent fluid. Common bile duct: Diameter: 6 mm Liver: No focal lesion identified. Within normal limits in parenchymal echogenicity. Portal vein is patent on color Doppler imaging with normal direction of blood flow towards the liver. Other: None. IMPRESSION: No biliary dilatation.  Gallbladder sludge.  No shadowing stones. Electronically Signed   By: Karen Kays M.D.   On: 02/27/2023 14:46   CT Soft Tissue Neck W Contrast Result Date: 02/26/2023 CLINICAL DATA:  Initial evaluation for soft tissue infection suspected. History of thyroid cancer. EXAM: CT NECK WITH CONTRAST TECHNIQUE: Multidetector CT imaging of the neck was performed using the standard protocol following the bolus administration of intravenous contrast. RADIATION DOSE REDUCTION: This exam was performed according to the departmental dose-optimization program which includes automated exposure control, adjustment of the mA and/or kV according to patient size and/or use of iterative reconstruction technique. CONTRAST:  OMNIPAQUE IOHEXOL 300 MG/ML  SOLN COMPARISON:  None Available. FINDINGS: Pharynx and larynx: Evaluation of the oral cavity limited by streak artifact from dental amalgam. No visible abnormality about the dentition. Oropharynx and nasopharynx within normal limits. No retropharyngeal collection or swelling. Negative epiglottis. Hypopharynx and supraglottic larynx within normal limits. Negative glottis. Subglottic airway clear. Salivary glands: Parotid and submandibular glands within normal limits. Thyroid: Markedly enlarged heterogeneous multinodular thyroid. Largest nodule on the left measures up to 4.8 cm. Heterogeneous enhancing  nodular density with central hypodensity involving the subcutaneous fat overlies the thyroid at the left lower anterior neck, measuring up to 2.9 cm (series 5, image 70). Findings indeterminate, but suspected to be thyroidal in origin, possibly a nodal metastasis. Lymph nodes: No other pathologically enlarged lymph nodes within the neck. Vascular: Normal intravascular enhancement seen within the neck. Limited intracranial: Unremarkable. Visualized orbits: Prior ocular lens replacement. Otherwise unremarkable. Mastoids and visualized paranasal sinuses: Paranasal sinuses are clear. Visualized mastoids and middle ear cavities are clear as well. Skeleton: No discrete or worrisome osseous lesions. Upper chest: No other acute finding. Other: None. IMPRESSION: 1. Markedly enlarged heterogeneous multinodular thyroid, with largest nodule on the left measuring up to 4.8 cm. Further evaluation with dedicated thyroid ultrasound recommended if not already performed. (Ref: J Am Coll Radiol. 2015 Feb;12(2): 143-50). 2. 2.9 cm heterogeneous enhancing nodule involving the subcutaneous fat overlying the thyroid at the left lower neck. Finding is indeterminate, but suspected to be thyroidal in origin, possibly a nodal metastasis. 3. No other acute abnormality within the neck. Electronically Signed   By: Rise Mu M.D.   On: 02/26/2023 22:42   CT ABDOMEN PELVIS W CONTRAST Result Date: 02/26/2023 CLINICAL DATA:  History of thyroid cancer. Weakness since biopsy 2 weeks ago. Abdominal pain. EXAM: CT ABDOMEN AND PELVIS WITH CONTRAST TECHNIQUE: Multidetector CT imaging of the abdomen and pelvis was performed using the standard protocol following bolus administration of intravenous contrast. RADIATION DOSE REDUCTION: This exam was performed according to the departmental dose-optimization program which includes automated exposure control, adjustment of the mA and/or kV according to patient size and/or use of iterative  reconstruction technique. CONTRAST:  OMNIPAQUE IOHEXOL 300 MG/ML  SOLN COMPARISON:  None Available. FINDINGS: Lower chest: No acute abnormality. Hepatobiliary: No focal hepatic lesion. Mild intra and extrahepatic biliary dilation. The common bile duct measures 12 mm in diameter. Small amount of layering sludge  in the gallbladder. Pancreas: Unremarkable. Spleen: Unremarkable. Adrenals/Urinary Tract: Normal adrenal glands. No urinary calculi or hydronephrosis. The bladder is obscured by streak artifact. Stomach/Bowel: Normal caliber large and small bowel without bowel wall thickening. Stomach and appendix are within normal limits. Vascular/Lymphatic: Aortic atherosclerosis. No enlarged abdominal or pelvic lymph nodes. Reproductive: Hysterectomy. Other: No free intraperitoneal fluid or air. Musculoskeletal: Left THA.  No acute fracture. IMPRESSION: 1. No acute abnormality in the abdomen or pelvis. 2. Mild intra and extrahepatic biliary dilation. Correlate with LFTs. If abnormal consider MRCP or ERCP for further evaluation. Aortic Atherosclerosis (ICD10-I70.0). Electronically Signed   By: Minerva Fester M.D.   On: 02/26/2023 22:29   DG Chest Port 1 View Result Date: 02/26/2023 CLINICAL DATA:  Questionable sepsis EXAM: PORTABLE CHEST 1 VIEW COMPARISON:  10/18/2013 FINDINGS: Heart is borderline in size. Mediastinal contours within normal limits. No confluent opacities, effusions or edema. No acute bony abnormality. IMPRESSION: No active disease. Electronically Signed   By: Charlett Nose M.D.   On: 02/26/2023 22:20    Carrie Hartshorn, DO  [facility]  If 7PM-7AM, please contact night-coverage [email or web address] Password [email or web address] 03/03/2023, 5:21 PM   LOS: 5 days

## 2023-03-03 NOTE — Plan of Care (Signed)
This pt has c-diff. She had several loose stools during the night. Pt complained of backside rawness and pain. Administered prn pain meds and applied barrier cream to raw areas. Pt starting to feel slightly better but continues to exp pain due to constant wiping and cleaning areas on backside affected by skin breakdown. Pt will be monitored for the duration of shift for further signs of deterioration.   Problem: Clinical Measurements: Goal: Ability to maintain clinical measurements within normal limits will improve Outcome: Progressing Goal: Will remain free from infection Outcome: Progressing Goal: Diagnostic test results will improve Outcome: Progressing

## 2023-03-04 ENCOUNTER — Inpatient Hospital Stay (HOSPITAL_COMMUNITY): Payer: 59

## 2023-03-04 DIAGNOSIS — R531 Weakness: Secondary | ICD-10-CM | POA: Diagnosis not present

## 2023-03-04 LAB — COMPREHENSIVE METABOLIC PANEL
ALT: 51 U/L — ABNORMAL HIGH (ref 0–44)
AST: 44 U/L — ABNORMAL HIGH (ref 15–41)
Albumin: 2.6 g/dL — ABNORMAL LOW (ref 3.5–5.0)
Alkaline Phosphatase: 128 U/L — ABNORMAL HIGH (ref 38–126)
Anion gap: 11 (ref 5–15)
BUN: 11 mg/dL (ref 8–23)
CO2: 23 mmol/L (ref 22–32)
Calcium: 10.9 mg/dL — ABNORMAL HIGH (ref 8.9–10.3)
Chloride: 106 mmol/L (ref 98–111)
Creatinine, Ser: 0.58 mg/dL (ref 0.44–1.00)
GFR, Estimated: >60 mL/min (ref >60)
Glucose, Bld: 87 mg/dL (ref 70–99)
Potassium: 3.4 mmol/L — ABNORMAL LOW (ref 3.5–5.1)
Sodium: 140 mmol/L (ref 135–145)
Total Bilirubin: 0.9 mg/dL (ref 0.0–1.2)
Total Protein: 5.7 g/dL — ABNORMAL LOW (ref 6.5–8.1)

## 2023-03-04 LAB — CBC
HCT: 38.8 % (ref 36.0–46.0)
Hemoglobin: 12.4 g/dL (ref 12.0–15.0)
MCH: 29.2 pg (ref 26.0–34.0)
MCHC: 32 g/dL (ref 30.0–36.0)
MCV: 91.3 fL (ref 80.0–100.0)
Platelets: 189 10*3/uL (ref 150–400)
RBC: 4.25 MIL/uL (ref 3.87–5.11)
RDW: 13.3 % (ref 11.5–15.5)
WBC: 16 10*3/uL — ABNORMAL HIGH (ref 4.0–10.5)
nRBC: 0 % (ref 0.0–0.2)

## 2023-03-04 LAB — MAGNESIUM
Magnesium: 1.3 mg/dL — ABNORMAL LOW (ref 1.7–2.4)
Magnesium: 1.9 mg/dL (ref 1.7–2.4)

## 2023-03-04 MED ORDER — POTASSIUM CHLORIDE CRYS ER 20 MEQ PO TBCR
40.0000 meq | EXTENDED_RELEASE_TABLET | Freq: Once | ORAL | Status: AC
  Administered 2023-03-04: 40 meq via ORAL
  Filled 2023-03-04: qty 2

## 2023-03-04 MED ORDER — MAGNESIUM OXIDE -MG SUPPLEMENT 400 (240 MG) MG PO TABS
400.0000 mg | ORAL_TABLET | Freq: Every day | ORAL | Status: DC
  Administered 2023-03-04: 400 mg via ORAL
  Filled 2023-03-04: qty 1

## 2023-03-04 MED ORDER — RISAQUAD PO CAPS
2.0000 | ORAL_CAPSULE | Freq: Three times a day (TID) | ORAL | Status: DC
  Administered 2023-03-04 – 2023-03-06 (×9): 2 via ORAL
  Filled 2023-03-04 (×9): qty 2

## 2023-03-04 MED ORDER — MAGNESIUM SULFATE 2 GM/50ML IV SOLN
2.0000 g | Freq: Once | INTRAVENOUS | Status: AC
  Administered 2023-03-04: 2 g via INTRAVENOUS
  Filled 2023-03-04: qty 50

## 2023-03-04 MED ORDER — PANTOPRAZOLE SODIUM 40 MG PO TBEC
40.0000 mg | DELAYED_RELEASE_TABLET | Freq: Every day | ORAL | Status: DC
  Administered 2023-03-04: 40 mg via ORAL
  Filled 2023-03-04: qty 1

## 2023-03-04 NOTE — Progress Notes (Signed)
Speech Language Pathology Treatment: Dysphagia  Patient Details Name: Carrie Fitzgerald MRN: [id number] DOB: [birth year] Today's Date: 03/04/2023 Time: 7829-5621 SLP Time Calculation (min) (ACUTE ONLY): 11 min  Assessment / Plan / Recommendation Clinical Impression  Ongoing diagnostic dysphagia therapy provided today. Pt consumed thin liquids and a few bites of soft textures without overt s/sx of oropharyngeal dysphagia. Pt reports if she "takes her time" she does ok. Recommend continue with current diet. SLP reviewed universal aspiration precautions. There are no further ST needs noted at this time, our service will sign off. Thank you,   HPI HPI: 78 year old female with a history of multinodular goiter, suspected thyroid malignancy, ovarian cancer, vitamin D deficiency, rheumatoid arthritis, hypertension presenting with generalized weakness.  The patient is a difficult historian.  History is supplemented by review of the medical record.  The patient had biopsy of her thyroid on 01/21/2023.  She followed up with her endocrinologist, Dr. Fransico Him on 02/10/2023.  At that time, the pathology from her biopsy was reviewed.  The cytology showed atypia of undetermined significance.  A sample was sent for Afrima testing which revealed 50% risk of malignancy.  There were also TERT positive findings of evidence of 90% risk of malignancy.  The patient was referred to Dr. Darnell Level for thyroidectomy.  She is rescheduled to follow-up with Dr. Fransico Him on 04/02/2023.  In the interim, the patient states that she has had some difficulty swallowing some solid foods and pills.  She has not had any difficulty swallowing liquids.  She denies any pain in her neck.  She denies any drainage or open wounds in her neck.  There is not been any worsening swelling or erythema in her neck. The patient presented because of decreased oral intake and increasing generalized weakness. BSE requested      SLP Plan  Continue with current plan of  care      Recommendations for follow up therapy are one component of a multi-disciplinary discharge planning process, led by the attending physician.  Recommendations may be updated based on patient status, additional functional criteria and insurance authorization.    Recommendations  Diet recommendations: Thin liquid;Regular Liquids provided via: Cup;Straw Medication Administration: Whole meds with liquid Supervision: Patient able to self feed Compensations: Slow rate;Small sips/bites;Follow solids with liquid                  Oral care BID     Dysphagia, unspecified (R13.10)     Continue with current plan of care    Carrie Fitzgerald H. Romie Levee, CCC-SLP Speech Language Pathologist  Georgetta Haber  03/04/2023, 4:15 PM

## 2023-03-04 NOTE — Progress Notes (Signed)
PROGRESS NOTE  Carrie Fitzgerald FAO:[id number] DOB: August 16, 1945 DOA: 02/26/2023 PCP: Harley Hallmark, NP  Brief History:  78 year old female with a history of multinodular goiter, suspected thyroid malignancy, ovarian cancer, vitamin D deficiency, rheumatoid arthritis, hypertension presenting with generalized weakness.  The patient is a difficult historian.  History is supplemented by review of the medical record.  The patient had biopsy of her thyroid on 01/21/2023.  She followed up with her endocrinologist, Dr. Fransico Him on 02/10/2023.  At that time, the pathology from her biopsy was reviewed.  The cytology showed atypia of undetermined significance.  A sample was sent for Afrima testing which revealed 50% risk of malignancy.  There were also TERT positive findings of evidence of 90% risk of malignancy.  The patient was referred to Dr. Darnell Level for thyroidectomy.  She is rescheduled to follow-up with Dr. Fransico Him on 04/02/2023.  In the interim, the patient states that she has had some difficulty swallowing some solid foods and pills.  She has not had any difficulty swallowing liquids.  She denies any pain in her neck.  She denies any drainage or open wounds in her neck.  There is not been any worsening swelling or erythema in her neck.  She denies any fevers, chills, headache, chest pain, shortness breath, coughing, hemoptysis.  She did have 1 episode of nausea and vomiting 1 day prior to admission.  She has had some loose stools without hematochezia or melena.  She denies any abdominal pain.  There is no dysuria or hematuria. The patient presented because of decreased oral intake and increasing generalized weakness. In the ED, the patient was afebrile with soft blood pressures down to 87/45.  She was tachycardic up to 111.  WBC 17.7, hemoglobin 9.8, platelets 319.  Sodium 140, potassium 4.1, bicarbonate 22, serum creatinine 1.20.  The patient was started on IV fluids.  Blood cultures were obtained.  She was  started on empiric antibiotics.  Once urine cultures and blood cultures were negative, vancomycin and cefepime were discontinued.  Her C. difficile antigen and PCR were positive.  In the setting of abdominal pain, leukocytosis, and diarrhea, vancomycin po was started with improvement of her abdominal pain and loose stools.    Subjective: The patient was seen and examined this morning, stable. Per patient and nursing staff patient continued to have multiple loose stools vomited x 1 this morning, had an episode of tachycardia was given metoprolol earlier this morning  Currently stable Elevated WBC to 16K, afebrile normotensive heart rate 100    Assessment/Plan: Sepsis-likely due to C. difficile colitis -Patient presented with leukocytosis and tachycardia -Tachycardic earlier this morning  Current vitals: BP (!) 145/72, pulse 100, temp 98.5 F (36.9 C), RR  18, , weight 63.3 kg, SpO2 99%.  -Sepsis present on admission  -Lactic acid 1.6 -Obtain UA 11-20 WBC -Chest x-ray without consolidation - COVID-19 PCR--neg -Follow blood cultures--neg to date -Check procalcitonin 0.18 -Empiric antibiotics pending culture data>>d/c abx 1/26   Generalized weakness -Multifactorial including dehydration, possible infection, and possible hypothyroidism -B12--1081 -Folic acid>40 -TSH <0.010 -PT evaluation>>SNF -UA 11-20 WBC   Pyuria -continue empiric abx pending culture data -1/26--d/c vanc and cefepime--cultures neg   Tachycardia/SVT -Another episode of tachycardia nausea vomiting this morning, IV metoprolol given -Heart rate stable 100 -During this admission was treated IV fluids, -personally reviewed EKG--f -increase metoprolol succinate dose to 50 mg   Cdiff colitis -Continue to have diarrhea but improved episodes -c diff  toxin neg, C diff PCR positive -present at time of admission -pt with 6-7 loose stools daily, leukocytosis and abdominal pain -start po vanco 03/01/23    AKI -baseline creatinine 0.6-0.9 -presented with serum creatinine 1.20 -continue IVF>>improved   Hyperthyroidism/multinodular goiter -Check free T4--5.49 -Check free T3--18.6 -TSH <0.010 -02/26/2023 CT neck soft tissues--oropharynx and nasopharynx WNL; no retropharyngeal collection or swelling.  Normal epiglottis.  Hypopharynx and supraglottic larynx WNL.  Enlarged heterogenous multinodular thyroid with largest nodule 4.8 cm. 2.9 cm heterogeneous enhancing nodule involving the subcutaneous fat overlying the thyroid at the left lower neck -02/27/23--case discussed with Dr. Derwood Kaplan to start methimazole   Hypercalcemia -Corrected calcium 11.7 at the time of admission -Likely related to abnormal thyroid functions and thyroid cancer -Continue IV fluids -Intact PTH--14 -Continue IV fluids   Essential hypertension -was Holding metoprolol succinate and HCTZ secondary to soft blood pressures initially -restart metoprolol succinate>>increase to 50 mg daily   Mixed hyperlipidemia -Continue statin   Sacral decubitus ulcer stage II -Local wound care>>Gerheardt's Butt paste   Hypomagnesemia/Hypokalemia -replete   Hyperbilirubinemia -02/27/23 RUQ US--GB sludge, no biliary ductal dilatation -fractionate bili -check LDH--126 -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's -improved  Right leg edema -venous duplex        Family Communication:   son updated 1/27   Consultants:  none   Code Status:  FULL    DVT Prophylaxis:   [place] Lovenox     Procedures: As Listed in Progress Note Above   Antibiotics: Vanc IV 1/24>>1/26 Cefepime 1/24>>1/26  Vanco po 1/26>>           Objective: Vitals:   03/03/23 0525 03/03/23 1430 03/03/23 2152 03/04/23 0500  BP: 131/74 120/68 (!) 151/76 (!) 145/72  Pulse: 98 96  100  Resp: 16 17 18 18   Temp: 97.6 F (36.4 C) 97.9 F (36.6 C) 98.6 F (37 C) 98.5 F (36.9 C)  TempSrc: Oral Oral  Oral  SpO2:  99% 100% 99%  Weight:      Height:        Intake/Output  Summary (Last 24 hours) at 03/04/2023 1213 Last data filed at 03/04/2023 0900 Gross per 24 hour  Intake 470 ml  Output 800 ml  Net -330 ml   Weight change:  Exam:      General:  AAO x 3,  cooperative, no distress;   HEENT:  Normocephalic, PERRL, otherwise with in Normal limits   Neuro:  CNII-XII intact. , normal motor and sensation, reflexes intact   Lungs:   Clear to auscultation BL, Respirations unlabored,  No wheezes / crackles  Cardio:    S1/S2, RRR, No murmure, No Rubs or Gallops   Abdomen:  Soft, non-tender, bowel sounds active all four quadrants, no guarding or peritoneal signs.  Muscular  skeletal:  Limited exam -global generalized weaknesses - in bed, able to move all 4 extremities,   2+ pulses,  symmetric, +1 pitting edema  Skin:  Dry, warm to touch, negative for any Rashes,  Wounds: Please see nursing documentation  Pressure Injury 02/27/23 Buttocks Right Stage 2 -  Partial thickness loss of dermis presenting as a shallow open injury with a red, pink wound bed without slough. Non-intact skin; pinkish/red wound base; no drainage (Active)  02/27/23 0101  Location: Buttocks  Location Orientation: Right  Staging: Stage 2 -  Partial thickness loss of dermis presenting as a shallow open injury with a red, pink wound bed without slough.  Wound Description (Comments): Non-intact skin; pinkish/red wound base; no drainage  Present  on Admission: Yes  Dressing Type Foam - Lift dressing to assess site every shift 03/04/23 0900     Pressure Injury 02/27/23 Right Stage 2 -  Partial thickness loss of dermis presenting as a shallow open injury with a red, pink wound bed without slough. Non-intact skin; pinkish/red wound bed; no drainage (Active)  02/27/23 0103  Location:   Location Orientation: Right  Staging: Stage 2 -  Partial thickness loss of dermis presenting as a shallow open injury with a red, pink wound bed without slough.  Wound Description (Comments): Non-intact skin;  pinkish/red wound bed; no drainage  Present on Admission: Yes  Dressing Type Foam - Lift dressing to assess site every shift 03/03/23 2020         Data Reviewed: I have personally reviewed following labs and imaging studies Basic Metabolic Panel: Recent Labs  Lab 02/27/23 0357 02/28/23 0458 03/01/23 0432 03/02/23 0408 03/03/23 0525 03/04/23 0047 03/04/23 0822  NA 136 143 143 141 138 140  --   K 3.4* 3.7 3.7 3.3* 3.8 3.4*  --   CL 104 111 110 108 107 106  --   CO2 22 22 24 25 23 23   --   GLUCOSE 88 107* 95 89 97 87  --   BUN 49* 33* 24* 16 14 11   --   CREATININE 0.95 0.61 0.55 0.56 0.58 0.58  --   CALCIUM 9.8 10.2 10.0 10.1 10.2 10.9*  --   MG 1.4* 1.9  --  1.2*  --  1.3* 1.9  PHOS 3.5  --   --   --   --   --   --    Liver Function Tests: Recent Labs  Lab 02/28/23 0458 03/01/23 0432 03/02/23 0408 03/03/23 0525 03/04/23 0047  AST 29 29 48* 42* 44*  ALT 33 34 48* 47* 51*  ALKPHOS 58 63 142* 119 128*  BILITOT 1.8* 1.5* 1.3* 1.1 0.9  PROT 5.2* 5.0* 4.9* 5.2* 5.7*  ALBUMIN 2.5* 2.3* 2.2* 2.2* 2.6*   Recent Labs  Lab 03/01/23 0432  LIPASE 28   No results for input(s): "AMMONIA" in the last 168 hours. Coagulation Profile: Recent Labs  Lab 02/26/23 2109  INR 1.1   CBC: Recent Labs  Lab 02/26/23 2108 02/27/23 0701 02/28/23 0458 03/01/23 0432 03/02/23 0408 03/03/23 0525 03/04/23 0047  WBC 17.7*   < > 11.8* 12.8* 11.4* 13.0* 16.0*  NEUTROABS 14.1*  --   --   --   --   --   --   HGB 9.8*   < > 10.1* 9.4* 9.6* 9.7* 12.4  HCT 31.9*   < > 32.4* 30.2* 29.8* 30.5* 38.8  MCV 93.5   < > 88.8 89.9 90.6 88.7 91.3  PLT 319   < > 168 137* 139* 139* 189   < > = values in this interval not displayed.   Cardiac Enzymes: No results for input(s): "CKTOTAL", "CKMB", "CKMBINDEX", "TROPONINI" in the last 168 hours. BNP: Invalid input(s): "POCBNP" CBG: No results for input(s): "GLUCAP" in the last 168 hours. HbA1C: No results for input(s): "HGBA1C" in the last 72  hours. Urine analysis:    Component Value Date/Time   COLORURINE YELLOW 02/27/2023 1340   APPEARANCEUR HAZY (A) 02/27/2023 1340   LABSPEC 1.032 (H) 02/27/2023 1340   PHURINE 5.0 02/27/2023 1340   GLUCOSEU NEGATIVE 02/27/2023 1340   HGBUR MODERATE (A) 02/27/2023 1340   BILIRUBINUR NEGATIVE 02/27/2023 1340   KETONESUR 5 (A) 02/27/2023 1340   PROTEINUR NEGATIVE  02/27/2023 1340   UROBILINOGEN 1.0 10/18/2013 1613   NITRITE NEGATIVE 02/27/2023 1340   LEUKOCYTESUR LARGE (A) 02/27/2023 1340   Sepsis Labs: @LABRCNTIP (procalcitonin:4,lacticidven:4) ) Recent Results (from the past 240 hours)  Blood Culture (routine x 2)     Status: None   Collection Time: 02/26/23  9:30 PM   Specimen: BLOOD  Result Value Ref Range Status   Specimen Description BLOOD BLOOD LEFT ARM  Final   Special Requests   Final    BOTTLES DRAWN AEROBIC AND ANAEROBIC Blood Culture adequate volume   Culture   Final    NO GROWTH 5 DAYS Performed at [facility], [address] [place], [place] [address]    Report Status 03/03/2023 FINAL  Final  Blood Culture (routine x 2)     Status: None   Collection Time: 02/26/23  9:31 PM   Specimen: BLOOD RIGHT HAND  Result Value Ref Range Status   Specimen Description   Final    BLOOD RIGHT HAND BOTTLES DRAWN AEROBIC AND ANAEROBIC   Special Requests Blood Culture adequate volume  Final   Culture   Final    NO GROWTH 5 DAYS Performed at [facility], [address] [place], [place] [address]    Report Status 03/03/2023 FINAL  Final  C Difficile Quick Screen w PCR reflex     Status: Abnormal   Collection Time: 02/27/23  6:40 AM   Specimen: STOOL  Result Value Ref Range Status   C Diff antigen POSITIVE (A) NEGATIVE Final   C Diff toxin NEGATIVE NEGATIVE Final   C Diff interpretation Results are indeterminate. See PCR results.  Final    Comment: Performed at [facility], [address], [place], [place] [address]  Gastrointestinal Panel by PCR , Stool     Status: None    Collection Time: 02/27/23  6:40 AM   Specimen: STOOL  Result Value Ref Range Status   Campylobacter species NOT DETECTED NOT DETECTED Final   Plesimonas shigelloides NOT DETECTED NOT DETECTED Final   Salmonella species NOT DETECTED NOT DETECTED Final   Yersinia enterocolitica NOT DETECTED NOT DETECTED Final   Vibrio species NOT DETECTED NOT DETECTED Final   Vibrio cholerae NOT DETECTED NOT DETECTED Final   Enteroaggregative E coli (EAEC) NOT DETECTED NOT DETECTED Final   Enteropathogenic E coli (EPEC) NOT DETECTED NOT DETECTED Final   Enterotoxigenic E coli (ETEC) NOT DETECTED NOT DETECTED Final   Shiga like toxin producing E coli (STEC) NOT DETECTED NOT DETECTED Final   Shigella/Enteroinvasive E coli (EIEC) NOT DETECTED NOT DETECTED Final   Cryptosporidium NOT DETECTED NOT DETECTED Final   Cyclospora cayetanensis NOT DETECTED NOT DETECTED Final   Entamoeba histolytica NOT DETECTED NOT DETECTED Final   Giardia lamblia NOT DETECTED NOT DETECTED Final   Adenovirus F40/41 NOT DETECTED NOT DETECTED Final   Astrovirus NOT DETECTED NOT DETECTED Final   Norovirus GI/GII NOT DETECTED NOT DETECTED Final   Rotavirus A NOT DETECTED NOT DETECTED Final   Sapovirus (I, II, IV, and V) NOT DETECTED NOT DETECTED Final    Comment: Performed at [facility], [address] [place], [place] [address]  C. Diff by PCR, Reflexed     Status: Abnormal   Collection Time: 02/27/23  6:40 AM  Result Value Ref Range Status   Toxigenic C. Difficile by PCR POSITIVE (A) NEGATIVE Final    Comment: Positive for toxigenic C. difficile with little to no toxin production. Only treat if clinical presentation suggests symptomatic illness. Performed  at [facility], [address], [place], [place] [address]   SARS Coronavirus 2 by RT PCR (hospital order, performed in [facility] hospital lab) *cepheid single result test* Anterior Nasal Swab     Status: None   Collection Time: 02/27/23 11:00 AM   Specimen:  Anterior Nasal Swab  Result Value Ref Range Status   SARS Coronavirus 2 by RT PCR NEGATIVE NEGATIVE Final    Comment: (NOTE) SARS-CoV-2 target nucleic acids are NOT DETECTED.  The SARS-CoV-2 RNA is generally detectable in upper and lower respiratory specimens during the acute phase of infection. The lowest concentration of SARS-CoV-2 viral copies this assay can detect is 250 copies / mL. A negative result does not preclude SARS-CoV-2 infection and should not be used as the sole basis for treatment or other patient management decisions.  A negative result may occur with improper specimen collection / handling, submission of specimen other than nasopharyngeal swab, presence of viral mutation(s) within the areas targeted by this assay, and inadequate number of viral copies (<250 copies / mL). A negative result must be combined with clinical observations, patient history, and epidemiological information.  Fact Sheet for Patients:   RoadLapTop.co.za  Fact Sheet for Healthcare Providers: http://kim-miller.com/  This test is not yet approved or  cleared by the [place] FDA and has been authorized for detection and/or diagnosis of SARS-CoV-2 by FDA under an Emergency Use Authorization (EUA).  This EUA will remain in effect (meaning this test can be used) for the duration of the COVID-19 declaration under Section 564(b)(1) of the Act, 21 U.S.C. section 360bbb-3(b)(1), unless the authorization is terminated or revoked sooner.  Performed at [facility], [address], [place], [place] [address]   Urine Culture     Status: None   Collection Time: 02/27/23  1:40 PM   Specimen: Urine, Random  Result Value Ref Range Status   Specimen Description   Final    URINE, RANDOM Performed at [facility], [address] [place], [place] [address]    Special Requests   Final    NONE Reflexed from [id number] Performed at [facility], [address], [place], [place] [address]    Culture   Final    NO GROWTH Performed at [facility], [address], [place], [place] [address]    Report Status 03/01/2023 FINAL  Final     Scheduled Meds:  sodium chloride   Intravenous Once   acidophilus  2 capsule Oral TID   enoxaparin (LOVENOX) injection  40 mg Subcutaneous Q24H   feeding supplement  237 mL Oral BID BM   Gerhardt's butt cream   Topical TID   magnesium oxide  400 mg Oral Daily   methIMAzole  5 mg Oral BID   metoprolol succinate  50 mg Oral Daily   pantoprazole  40 mg Oral Daily   vancomycin  125 mg Oral QID   Continuous Infusions:    Procedures/Studies: US Venous Img Lower Unilateral Right (DVT) Result Date: 03/04/2023 CLINICAL DATA:  Right lower extremity pain.  Evaluate for DVT. EXAM: RIGHT LOWER EXTREMITY VENOUS DOPPLER ULTRASOUND TECHNIQUE: Gray-scale sonography with graded compression, as well as color Doppler and duplex ultrasound were performed to evaluate the lower extremity deep venous systems from the level of the common femoral vein and including the common femoral, femoral, profunda femoral, popliteal and calf veins including the posterior tibial, peroneal and gastrocnemius veins when visible. The superficial great saphenous vein was also interrogated. Spectral Doppler  was utilized to evaluate flow at rest and with distal augmentation maneuvers in the common femoral, femoral and popliteal veins. COMPARISON:  None Available. FINDINGS: Contralateral Common Femoral Vein: Respiratory phasicity is normal and symmetric with the symptomatic side. No evidence of thrombus. Normal compressibility. Common Femoral Vein: No evidence of thrombus. Normal compressibility, respiratory phasicity and response to augmentation. Saphenofemoral Junction: No evidence of thrombus. Normal compressibility and flow on color Doppler imaging. Profunda Femoral Vein: No evidence of thrombus. Normal compressibility and flow on color Doppler imaging.  Femoral Vein: No evidence of thrombus. Normal compressibility, respiratory phasicity and response to augmentation. Popliteal Vein: There is mixed echogenic nonocclusive wall thickening/DVT within the right popliteal vein (images 26 and 27) Calf Veins: No evidence of thrombus. Normal compressibility and flow on color Doppler imaging. Superficial Great Saphenous Vein: No evidence of thrombus. Normal compressibility. Other Findings:  None. IMPRESSION: 1. No evidence of occlusive DVT within the right lower extremity. 2. Mixed echogenic nonocclusive wall thickening/DVT within the right popliteal vein, favored to be chronic in etiology though technically age-indeterminate in the absence of prior examinations. Electronically Signed   By: Simonne Come M.D.   On: 03/04/2023 11:01   US Abdomen Limited RUQ (LIVER/GB) Result Date: 02/27/2023 CLINICAL DATA:  Hyperbilirubinemia EXAM: ULTRASOUND ABDOMEN LIMITED RIGHT UPPER QUADRANT COMPARISON:  CT 02/26/2023 FINDINGS: Gallbladder: Gallbladder is distended. There is some layering sludge. No shadowing stones. No adjacent fluid. Common bile duct: Diameter: 6 mm Liver: No focal lesion identified. Within normal limits in parenchymal echogenicity. Portal vein is patent on color Doppler imaging with normal direction of blood flow towards the liver. Other: None. IMPRESSION: No biliary dilatation.  Gallbladder sludge.  No shadowing stones. Electronically Signed   By: Karen Kays M.D.   On: 02/27/2023 14:46   CT Soft Tissue Neck W Contrast Result Date: 02/26/2023 CLINICAL DATA:  Initial evaluation for soft tissue infection suspected. History of thyroid cancer. EXAM: CT NECK WITH CONTRAST TECHNIQUE: Multidetector CT imaging of the neck was performed using the standard protocol following the bolus administration of intravenous contrast. RADIATION DOSE REDUCTION: This exam was performed according to the departmental dose-optimization program which includes automated exposure control,  adjustment of the mA and/or kV according to patient size and/or use of iterative reconstruction technique. CONTRAST:  OMNIPAQUE IOHEXOL 300 MG/ML  SOLN COMPARISON:  None Available. FINDINGS: Pharynx and larynx: Evaluation of the oral cavity limited by streak artifact from dental amalgam. No visible abnormality about the dentition. Oropharynx and nasopharynx within normal limits. No retropharyngeal collection or swelling. Negative epiglottis. Hypopharynx and supraglottic larynx within normal limits. Negative glottis. Subglottic airway clear. Salivary glands: Parotid and submandibular glands within normal limits. Thyroid: Markedly enlarged heterogeneous multinodular thyroid. Largest nodule on the left measures up to 4.8 cm. Heterogeneous enhancing nodular density with central hypodensity involving the subcutaneous fat overlies the thyroid at the left lower anterior neck, measuring up to 2.9 cm (series 5, image 70). Findings indeterminate, but suspected to be thyroidal in origin, possibly a nodal metastasis. Lymph nodes: No other pathologically enlarged lymph nodes within the neck. Vascular: Normal intravascular enhancement seen within the neck. Limited intracranial: Unremarkable. Visualized orbits: Prior ocular lens replacement. Otherwise unremarkable. Mastoids and visualized paranasal sinuses: Paranasal sinuses are clear. Visualized mastoids and middle ear cavities are clear as well. Skeleton: No discrete or worrisome osseous lesions. Upper chest: No other acute finding. Other: None. IMPRESSION: 1. Markedly enlarged heterogeneous multinodular thyroid, with largest nodule on the left measuring up to 4.8 cm. Further evaluation  with dedicated thyroid ultrasound recommended if not already performed. (Ref: J Am Coll Radiol. 2015 Feb;12(2): 143-50). 2. 2.9 cm heterogeneous enhancing nodule involving the subcutaneous fat overlying the thyroid at the left lower neck. Finding is indeterminate, but suspected to be  thyroidal in origin, possibly a nodal metastasis. 3. No other acute abnormality within the neck. Electronically Signed   By: Rise Mu M.D.   On: 02/26/2023 22:42   CT ABDOMEN PELVIS W CONTRAST Result Date: 02/26/2023 CLINICAL DATA:  History of thyroid cancer. Weakness since biopsy 2 weeks ago. Abdominal pain. EXAM: CT ABDOMEN AND PELVIS WITH CONTRAST TECHNIQUE: Multidetector CT imaging of the abdomen and pelvis was performed using the standard protocol following bolus administration of intravenous contrast. RADIATION DOSE REDUCTION: This exam was performed according to the departmental dose-optimization program which includes automated exposure control, adjustment of the mA and/or kV according to patient size and/or use of iterative reconstruction technique. CONTRAST:  OMNIPAQUE IOHEXOL 300 MG/ML  SOLN COMPARISON:  None Available. FINDINGS: Lower chest: No acute abnormality. Hepatobiliary: No focal hepatic lesion. Mild intra and extrahepatic biliary dilation. The common bile duct measures 12 mm in diameter. Small amount of layering sludge in the gallbladder. Pancreas: Unremarkable. Spleen: Unremarkable. Adrenals/Urinary Tract: Normal adrenal glands. No urinary calculi or hydronephrosis. The bladder is obscured by streak artifact. Stomach/Bowel: Normal caliber large and small bowel without bowel wall thickening. Stomach and appendix are within normal limits. Vascular/Lymphatic: Aortic atherosclerosis. No enlarged abdominal or pelvic lymph nodes. Reproductive: Hysterectomy. Other: No free intraperitoneal fluid or air. Musculoskeletal: Left THA.  No acute fracture. IMPRESSION: 1. No acute abnormality in the abdomen or pelvis. 2. Mild intra and extrahepatic biliary dilation. Correlate with LFTs. If abnormal consider MRCP or ERCP for further evaluation. Aortic Atherosclerosis (ICD10-I70.0). Electronically Signed   By: Minerva Fester M.D.   On: 02/26/2023 22:29   DG Chest Port 1 View Result Date:  02/26/2023 CLINICAL DATA:  Questionable sepsis EXAM: PORTABLE CHEST 1 VIEW COMPARISON:  10/18/2013 FINDINGS: Heart is borderline in size. Mediastinal contours within normal limits. No confluent opacities, effusions or edema. No acute bony abnormality. IMPRESSION: No active disease. Electronically Signed   By: Charlett Nose M.D.   On: 02/26/2023 22:20    Kendell Bane, MD   [facility]  Total of 35 minutes was spent evaluating patient.  Reviewing all medical records, current medications, labs, drawn plan of care   If 7PM-7AM, please contact night-coverage [email or web address] Password [email or web address] 03/04/2023, 12:13 PM   LOS: 6 days

## 2023-03-04 NOTE — Plan of Care (Signed)
Problem: Clinical Measurements: Goal: Ability to maintain clinical measurements within normal limits will improve Outcome: Progressing Goal: Will remain free from infection Outcome: Progressing Goal: Diagnostic test results will improve Outcome: Progressing   Problem: Skin Integrity: Goal: Risk for impaired skin integrity will decrease Outcome: Progressing

## 2023-03-04 NOTE — TOC Progression Note (Signed)
Transition of Care [facility]) - Progression Note    Patient Details  Name: Carrie Fitzgerald No MRN: [id number] Date of Birth: 07/12/1945  Transition of Care [facility]) CM/SW Contact  [place], [place] Phone Number: 03/04/2023, 11:33 AM  Clinical Narrative:    CSW reached out to [place] in admissions at [facility]. Secure VM left requesting call back for update on insurance authorization status. TOC to follow.   Expected Discharge Plan: Skilled Nursing Facility Barriers to Discharge: Insurance Authorization  Expected Discharge Plan and Services In-house Referral: Clinical Social Work Discharge Planning Services: CM Consult Post Acute Care Choice: Skilled Nursing Facility Living arrangements for the past 2 months: Single Family Home                                       Social Determinants of Health (SDOH) Interventions SDOH Screenings   Food Insecurity: No Food Insecurity (02/27/2023)  Housing: Unknown (02/27/2023)  Transportation Needs: No Transportation Needs (02/27/2023)  Utilities: Not At Risk (02/27/2023)  Social Connections: Unknown (02/28/2023)  Tobacco Use: Low Risk  (02/27/2023)    Readmission Risk Interventions    02/27/2023   12:39 PM  Readmission Risk Prevention Plan  Medication Screening Complete  Transportation Screening Complete

## 2023-03-04 NOTE — Progress Notes (Signed)
Patient was given Potassium and Magnesium replacement early this morning.  Patient has continued to have multiple loose stools.  Patient vomited about 200cc this morning. Heart rate in the 120-130's reported to [facility] and orders given to give Metoprolol early.

## 2023-03-04 NOTE — Progress Notes (Signed)
Physical Therapy Treatment Patient Details Name: Carrie Fitzgerald MRN: [id number] DOB: Dec 08, 1945 Today's Date: 03/04/2023   History of Present Illness Carrie Fitzgerald is a 78 y.o. female with medical history significant of Thyroid cancer status post recent biopsy who presents to the emergency department due to concern for dehydration.  Patient states that she has been having decreased fluid and food intake since she had biopsy for the thyroid cancer about 2 weeks ago.  She has been having increasing difficulty in being able to swallow food since the biopsy was done and patient has also been having increasing weakness.  She has lost about 12 pounds since onset of symptoms per caretaker at bedside.  She had a couple of episodes of nonbloody vomiting yesterday and has also had intermittent diarrhea.  She denies fever, chest pain, shortness of breath, cough    PT Comments  Patient was agreeable to therapy. Needing mod assist for supine to sit and assist in scooting to EOB. With RW and mod/max assist from PT patient transferred from bed to [facility]. Then patient completed BSC to bed to chair transfer. Using a RW and assist from PT. Patient took a moment to rest inbetween each transfer segment due to fatigue. Once in chair patient performed LE exercises and maintained balance during movement. Patient was left in chair with call bell at conclusion of session. Patient will benefit from continued skilled physical therapy in hospital and recommended venue below to increase strength, balance, endurance for safe ADLs and gait.      If plan is discharge home, recommend the following: A lot of help with walking and/or transfers;A lot of help with bathing/dressing/bathroom;Assistance with cooking/housework;Help with stairs or ramp for entrance   Can travel by private vehicle     No  Equipment Recommendations  None recommended by PT    Recommendations for Other Services       Precautions / Restrictions  Precautions Precautions: Fall Restrictions Weight Bearing Restrictions Per Provider Order: No     Mobility  Bed Mobility Overal bed mobility: Needs Assistance Bed Mobility: Supine to Sit     Supine to sit: Mod assist, Max assist     General bed mobility comments: mod assist to bring trunk fully upright and to come fully to the edge of the bed Patient Response: Cooperative  Transfers Overall transfer level: Needs assistance Equipment used: Rolling walker (2 wheels) Transfers: Sit to/from Stand, Bed to chair/wheelchair/BSC Sit to Stand: Max assist, Mod assist   Step pivot transfers: Mod assist, Max assist       General transfer comment: max assist for initial boost; then mod assist to take a step over to the chair    Ambulation/Gait Ambulation/Gait assistance: Mod assist, Max assist Gait Distance (Feet): 7 Feet (side steps\) Assistive device: Rolling walker (2 wheels) Gait Pattern/deviations: Trunk flexed, Decreased step length - right, Decreased step length - left, Step-to pattern Gait velocity: slow     General Gait Details: very unsteady, heavy reliance on AD   Stairs             Wheelchair Mobility     Tilt Bed Tilt Bed Patient Response: Cooperative  Modified Rankin (Stroke Patients Only)       Balance Overall balance assessment: Needs assistance Sitting-balance support: Feet supported, Bilateral upper extremity supported Sitting balance-Leahy Scale: Fair Sitting balance - Comments: fair sitting balance on edge of bed Postural control: Posterior lean Standing balance support: Bilateral upper extremity supported, Reliant on assistive device for balance, During functional  activity Standing balance-Leahy Scale: Fair Standing balance comment: poor to fair standing balance with RW and PT mod assist                            Cognition Arousal: Alert Behavior During Therapy: WFL for tasks assessed/performed Overall Cognitive Status:  Within Functional Limits for tasks assessed                                          Exercises General Exercises - Lower Extremity Long Arc Quad: Seated, AROM, Both, 10 reps Hip Flexion/Marching: Seated, AROM, Both, 10 reps Toe Raises: Seated, AROM, Both, 10 reps Heel Raises: Seated, AROM, Both, 10 reps    General Comments        Pertinent Vitals/Pain Pain Assessment Pain Assessment: No/denies pain    Home Living                          Prior Function            PT Goals (current goals can now be found in the care plan section) Acute Rehab PT Goals Patient Stated Goal: return home after rehab PT Goal Formulation: With patient Time For Goal Achievement: 03/13/23 Potential to Achieve Goals: Good Progress towards PT goals: Progressing toward goals    Frequency    Min 3X/week      PT Plan      Co-evaluation              AM-PAC PT "6 Clicks" Mobility   Outcome Measure  Help needed turning from your back to your side while in a flat bed without using bedrails?: A Lot Help needed moving from lying on your back to sitting on the side of a flat bed without using bedrails?: A Lot Help needed moving to and from a bed to a chair (including a wheelchair)?: A Lot Help needed standing up from a chair using your arms (e.g., wheelchair or bedside chair)?: A Lot Help needed to walk in hospital room?: A Lot Help needed climbing 3-5 steps with a railing? : Total 6 Click Score: 11    End of Session   Activity Tolerance: Patient tolerated treatment well;Patient limited by fatigue Patient left: in chair;with call bell/phone within reach Nurse Communication: Mobility status PT Visit Diagnosis: Unsteadiness on feet (R26.81);Muscle weakness (generalized) (M62.81);Other abnormalities of gait and mobility (R26.89)     Time: 1610-9604 PT Time Calculation (min) (ACUTE ONLY): 34 min  Charges:    $Therapeutic Exercise: 8-22 mins $Therapeutic  Activity: 8-22 mins PT General Charges $$ ACUTE PT VISIT: 1 Visit                     Shenica Holzheimer SPT

## 2023-03-05 DIAGNOSIS — R531 Weakness: Secondary | ICD-10-CM | POA: Diagnosis not present

## 2023-03-05 LAB — BASIC METABOLIC PANEL
Anion gap: 7 (ref 5–15)
BUN: 11 mg/dL (ref 8–23)
CO2: 21 mmol/L — ABNORMAL LOW (ref 22–32)
Calcium: 11 mg/dL — ABNORMAL HIGH (ref 8.9–10.3)
Chloride: 110 mmol/L (ref 98–111)
Creatinine, Ser: 0.54 mg/dL (ref 0.44–1.00)
GFR, Estimated: >60 mL/min (ref >60)
Glucose, Bld: 107 mg/dL — ABNORMAL HIGH (ref 70–99)
Potassium: 4 mmol/L (ref 3.5–5.1)
Sodium: 138 mmol/L (ref 135–145)

## 2023-03-05 MED ORDER — ZINC OXIDE 40 % EX OINT: TOPICAL_OINTMENT | Freq: Four times a day (QID) | CUTANEOUS | 0 refills | Status: AC

## 2023-03-05 MED ORDER — ONDANSETRON HCL 4 MG PO TABS: 4.0000 mg | ORAL_TABLET | Freq: Four times a day (QID) | ORAL | 0 refills | Status: AC | PRN

## 2023-03-05 MED ORDER — GERHARDT'S BUTT CREAM: 1.0000 | TOPICAL_CREAM | Freq: Three times a day (TID) | CUTANEOUS | 0 refills | Status: DC

## 2023-03-05 MED ORDER — VANCOMYCIN HCL 125 MG PO CAPS: 125.0000 mg | ORAL_CAPSULE | Freq: Four times a day (QID) | ORAL | 0 refills | Status: AC

## 2023-03-05 MED ORDER — RISAQUAD PO CAPS: 2.0000 | ORAL_CAPSULE | Freq: Three times a day (TID) | ORAL | 0 refills | Status: AC

## 2023-03-05 MED ORDER — PANTOPRAZOLE SODIUM 40 MG PO TBEC: 40.0000 mg | DELAYED_RELEASE_TABLET | Freq: Every day | ORAL | Status: DC

## 2023-03-05 MED ORDER — ZINC OXIDE 40 % EX OINT: TOPICAL_OINTMENT | Freq: Four times a day (QID) | CUTANEOUS | Status: DC

## 2023-03-05 MED ORDER — GERHARDT'S BUTT CREAM
TOPICAL_CREAM | Freq: Four times a day (QID) | CUTANEOUS | Status: DC
  Filled 2023-03-05: qty 60

## 2023-03-05 MED ORDER — TRAMADOL HCL 50 MG PO TABS: 50.0000 mg | ORAL_TABLET | Freq: Every morning | ORAL | 0 refills | Status: AC

## 2023-03-05 NOTE — Plan of Care (Signed)
Problem: Clinical Measurements: Goal: Ability to maintain clinical measurements within normal limits will improve Outcome: Progressing Goal: Will remain free from infection Outcome: Progressing Goal: Diagnostic test results will improve Outcome: Progressing   Problem: Skin Integrity: Goal: Risk for impaired skin integrity will decrease Outcome: Progressing

## 2023-03-05 NOTE — Progress Notes (Signed)
Mobility Specialist Progress Note:    03/05/23 1450  Mobility  Activity Transferred from chair to bed;Stood at bedside  Level of Assistance Moderate assist, patient does 50-74%  Assistive Device Front wheel walker  Distance Ambulated (ft) 2 ft  Range of Motion/Exercises Active;All extremities  Activity Response Tolerated well  Mobility Referral Yes  Mobility visit 1 Mobility  Mobility Specialist Start Time (ACUTE ONLY) 1440  Mobility Specialist Stop Time (ACUTE ONLY) 1455  Mobility Specialist Time Calculation (min) (ACUTE ONLY) 15 min   Pt received in chair requesting assistance to bed. NT in room for necessary peri care. Required ModA to stand and transfer with RW. Tolerated well, asx throughout. Left pt supine with NT, all needs met.   Lawerance Bach Mobility Specialist Please contact via Special educational needs teacher or  Rehab office at [phone]

## 2023-03-05 NOTE — Discharge Summary (Signed)
Physician Discharge Summary   Patient: Carrie Fitzgerald MRN: [id number] DOB: 1945-06-27  Admit date:     02/26/2023  Discharge date: 03/05/23  Discharge Physician: Kendell Bane   PCP: Harley Hallmark, NP   Recommendations at discharge:    Follow with the PCP in 2-4 weeks -Continue current recommended medications -Medications are subject to change by PCP -Continue PT OT, fall precautions  Discharge Diagnoses: Principal Problem:   Generalized weakness Active Problems:   Mixed hyperlipidemia   Thyroid malignant neoplasm (HCC)   Multinodular goiter   Failure to thrive in adult   Pressure injury of skin   Hypoalbuminemia due to protein-calorie malnutrition (HCC)   Leukocytosis   Hypercalcemia   Essential hypertension   Sacral decubitus ulcer, stage II (HCC)   SIRS (systemic inflammatory response syndrome) (HCC)   C. difficile colitis   Sepsis due to undetermined organism (HCC)   AKI (acute kidney injury) (HCC)  Resolved Problems:   * No resolved hospital problems. *  Hospital Course: 78 year old female with a history of multinodular goiter, suspected thyroid malignancy, ovarian cancer, vitamin D deficiency, rheumatoid arthritis, hypertension presenting with generalized weakness.  The patient is a difficult historian.  History is supplemented by review of the medical record.  The patient had biopsy of her thyroid on 01/21/2023.  She followed up with her endocrinologist, Dr. Fransico Him on 02/10/2023.  At that time, the pathology from her biopsy was reviewed.  The cytology showed atypia of undetermined significance.  A sample was sent for Afrima testing which revealed 50% risk of malignancy.  There were also TERT positive findings of evidence of 90% risk of malignancy.  The patient was referred to Dr. Darnell Level for thyroidectomy.  She is rescheduled to follow-up with Dr. Fransico Him on 04/02/2023.  In the interim, the patient states that she has had some difficulty swallowing some solid foods and  pills.  She has not had any difficulty swallowing liquids.  She denies any pain in her neck.  She denies any drainage or open wounds in her neck.  There is not been any worsening swelling or erythema in her neck.  She denies any fevers, chills, headache, chest pain, shortness breath, coughing, hemoptysis.  She did have 1 episode of nausea and vomiting 1 day prior to admission.  She has had some loose stools without hematochezia or melena.  She denies any abdominal pain.  There is no dysuria or hematuria. The patient presented because of decreased oral intake and increasing generalized weakness. In the ED, the patient was afebrile with soft blood pressures down to 87/45.  She was tachycardic up to 111.  WBC 17.7, hemoglobin 9.8, platelets 319.  Sodium 140, potassium 4.1, bicarbonate 22, serum creatinine 1.20.  The patient was started on IV fluids.  Blood cultures were obtained.  She was started on empiric antibiotics.  Once urine cultures and blood cultures were negative, vancomycin and cefepime were discontinued.  Her C. difficile antigen and PCR were positive.  In the setting of abdominal pain, leukocytosis, and diarrhea, vancomycin po was started with improvement of her abdominal pain and loose stools.  Sepsis-likely due to C. difficile colitis -Resolved sepsis physiology   -Sepsis present on admission   -Lactic acid 1.6 -Obtain UA 11-20 WBC -Chest x-ray without consolidation - COVID-19 PCR--neg -Follow blood cultures--neg to date -Check procalcitonin 0.18 -Empiric antibiotics pending culture data>>d/c abx 1/26   Generalized weakness -Multifactorial including dehydration, possible infection, and possible hypothyroidism -B12--1081 -Folic acid>40 -TSH <0.010 -PT evaluation>>SNF -UA 11-20  WBC   Pyuria -continue empiric abx pending culture data -1/26--d/c vanc and cefepime--cultures neg   Tachycardia/SVT -Much improved -During this admission was treated IV fluids, -personally reviewed  EKG-- -increase metoprolol succinate dose to 50 mg   Cdiff colitis -Continue to have diarrhea but improved episodes -c diff toxin neg, C diff PCR positive -present at time of admission -pt with 6-7 loose stools daily, leukocytosis and abdominal pain -start po vanco 03/01/23   AKI -baseline creatinine 0.6-0.9 -presented with serum creatinine 1.20 -continue IVF>>improved   Hyperthyroidism/multinodular goiter -Check free T4--5.49 -Check free T3--18.6 -TSH <0.010 -02/26/2023 CT neck soft tissues--oropharynx and nasopharynx WNL; no retropharyngeal collection or swelling.  Normal epiglottis.  Hypopharynx and supraglottic larynx WNL.  Enlarged heterogenous multinodular thyroid with largest nodule 4.8 cm. 2.9 cm heterogeneous enhancing nodule involving the subcutaneous fat overlying the thyroid at the left lower neck -02/27/23--case discussed with Dr. Derwood Kaplan to start methimazole   Hypercalcemia -Corrected calcium 11.7 at the time of admission -Likely related to abnormal thyroid functions and thyroid cancer -Continue IV fluids -Intact PTH--14 -S/p  IV fluids   Essential hypertension -was Holding metoprolol succinate and HCTZ secondary to soft blood pressures initially -restart metoprolol succinate>>increase to 50 mg daily   Mixed hyperlipidemia -Continue statin   Sacral decubitus ulcer stage II -Local wound care>>Gerheardt's Butt paste   Hypomagnesemia/Hypokalemia -replete   Hyperbilirubinemia -02/27/23 RUQ US--GB sludge, no biliary ductal dilatation -fractionate bili -check LDH--126 -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 and patient was instructed, not to drive, operate heavy machinery, perform activities at heights, swimming or participation in water activities or provide baby-sitting services while on Pain, Sleep and Anxiety Medications; until their  outpatient Physician has advised to do so again. Also recommended to not to take more than prescribed Pain, Sleep and Anxiety Medications.    Disposition: Skilled nursing facility Diet recommendation:  Discharge Diet Orders (From admission, onward)     Start     Ordered   03/05/23 0000  Diet - low sodium heart healthy        03/05/23 1158           Regular diet DISCHARGE MEDICATION: Allergies as of 03/05/2023   No Known Allergies      Medication List     PAUSE taking these medications    methotrexate 2.5 MG tablet Wait to take this until: April 02, 2023 Commonly known as: RHEUMATREX Take 10 mg by mouth every Thursday. Caution:Chemotherapy. Protect from light.       STOP taking these medications    folic acid 1 MG tablet Commonly known as: FOLVITE   hydrochlorothiazide 50 MG tablet Commonly known as: HYDRODIURIL   rosuvastatin 5 MG tablet Commonly known as: Crestor       TAKE these medications    acidophilus Caps capsule Take 2 capsules by mouth 3 (three) times daily for 14 days.   Calcium 600+D 600-400 MG-UNIT tablet Generic drug: Calcium Carbonate-Vitamin D Take 1 tablet by mouth daily.   Gerhardt's butt cream Crea Apply 1 Application topically 3 (three) times daily.   metoprolol succinate 50 MG 24 hr tablet Commonly known as: TOPROL-XL Take 1 tablet by mouth daily.   ondansetron 4 MG tablet Commonly known as: ZOFRAN Take 1 tablet (4 mg total) by mouth every 6 (six) hours as needed for nausea.   potassium chloride SA 20 MEQ tablet Commonly  known as: KLOR-CON M Take 20 mEq by mouth daily.   traMADol 50 MG tablet Commonly known as: ULTRAM Take 1 tablet (50 mg total) by mouth every morning for 7 days.   vancomycin 125 MG capsule Commonly known as: VANCOCIN Take 1 capsule (125 mg total) by mouth 4 (four) times daily.               Discharge Care Instructions  (From admission, onward)           Start     Ordered    03/05/23 0000  Discharge wound care:       Comments: Wound care for right buttock stage II pressure ulcers early stage Reposition every 2 hours in bed   03/05/23 1158            Contact information for after-discharge care     Destination     [facility] .   Service: Skilled Nursing Contact information: [address] [place] [address] [phone]                    Discharge Exam: Ceasar Mons Weights   02/26/23 2052 02/27/23 0041  Weight: 65.6 kg 63.3 kg        General:  AAO x 3,  cooperative, no distress;   HEENT:  Normocephalic, PERRL, otherwise with in Normal limits   Neuro:  CNII-XII intact. , normal motor and sensation, reflexes intact   Lungs:   Clear to auscultation BL, Respirations unlabored,  No wheezes / crackles  Cardio:    S1/S2, RRR, No murmure, No Rubs or Gallops   Abdomen:  Soft, non-tender, bowel sounds active all four quadrants, no guarding or peritoneal signs.  Muscular  skeletal:  Limited exam -global generalized weaknesses - in bed, able to move all 4 extremities,   2+ pulses,  symmetric, No pitting edema  Skin:  Dry, warm to touch, negative for any Rashes,  Wounds: Please see nursing documentation  Pressure Injury 02/27/23 Buttocks Right Stage 2 -  Partial thickness loss of dermis presenting as a shallow open injury with a red, pink wound bed without slough. Non-intact skin; pinkish/red wound base; no drainage (Active)  02/27/23 0101  Location: Buttocks  Location Orientation: Right  Staging: Stage 2 -  Partial thickness loss of dermis presenting as a shallow open injury with a red, pink wound bed without slough.  Wound Description (Comments): Non-intact skin; pinkish/red wound base; no drainage  Present on Admission: Yes  Dressing Type Foam - Lift dressing to assess site every shift 03/04/23 0900     Pressure Injury 02/27/23 Right Stage 2 -  Partial thickness loss of dermis presenting as a shallow open  injury with a red, pink wound bed without slough. Non-intact skin; pinkish/red wound bed; no drainage (Active)  02/27/23 0103  Location:   Location Orientation: Right  Staging: Stage 2 -  Partial thickness loss of dermis presenting as a shallow open injury with a red, pink wound bed without slough.  Wound Description (Comments): Non-intact skin; pinkish/red wound bed; no drainage  Present on Admission: Yes  Dressing Type Foam - Lift dressing to assess site every shift 03/03/23 2020          Condition at discharge: good  The results of significant diagnostics from this hospitalization (including imaging, microbiology, ancillary and laboratory) are listed below for reference.   Imaging Studies: US Venous Img Lower Unilateral Right (DVT) Result Date: 03/04/2023 CLINICAL DATA:  Right lower extremity  pain.  Evaluate for DVT. EXAM: RIGHT LOWER EXTREMITY VENOUS DOPPLER ULTRASOUND TECHNIQUE: Gray-scale sonography with graded compression, as well as color Doppler and duplex ultrasound were performed to evaluate the lower extremity deep venous systems from the level of the common femoral vein and including the common femoral, femoral, profunda femoral, popliteal and calf veins including the posterior tibial, peroneal and gastrocnemius veins when visible. The superficial great saphenous vein was also interrogated. Spectral Doppler was utilized to evaluate flow at rest and with distal augmentation maneuvers in the common femoral, femoral and popliteal veins. COMPARISON:  None Available. FINDINGS: Contralateral Common Femoral Vein: Respiratory phasicity is normal and symmetric with the symptomatic side. No evidence of thrombus. Normal compressibility. Common Femoral Vein: No evidence of thrombus. Normal compressibility, respiratory phasicity and response to augmentation. Saphenofemoral Junction: No evidence of thrombus. Normal compressibility and flow on color Doppler imaging. Profunda Femoral Vein: No  evidence of thrombus. Normal compressibility and flow on color Doppler imaging. Femoral Vein: No evidence of thrombus. Normal compressibility, respiratory phasicity and response to augmentation. Popliteal Vein: There is mixed echogenic nonocclusive wall thickening/DVT within the right popliteal vein (images 26 and 27) Calf Veins: No evidence of thrombus. Normal compressibility and flow on color Doppler imaging. Superficial Great Saphenous Vein: No evidence of thrombus. Normal compressibility. Other Findings:  None. IMPRESSION: 1. No evidence of occlusive DVT within the right lower extremity. 2. Mixed echogenic nonocclusive wall thickening/DVT within the right popliteal vein, favored to be chronic in etiology though technically age-indeterminate in the absence of prior examinations. Electronically Signed   By: Simonne Come M.D.   On: 03/04/2023 11:01   US Abdomen Limited RUQ (LIVER/GB) Result Date: 02/27/2023 CLINICAL DATA:  Hyperbilirubinemia EXAM: ULTRASOUND ABDOMEN LIMITED RIGHT UPPER QUADRANT COMPARISON:  CT 02/26/2023 FINDINGS: Gallbladder: Gallbladder is distended. There is some layering sludge. No shadowing stones. No adjacent fluid. Common bile duct: Diameter: 6 mm Liver: No focal lesion identified. Within normal limits in parenchymal echogenicity. Portal vein is patent on color Doppler imaging with normal direction of blood flow towards the liver. Other: None. IMPRESSION: No biliary dilatation.  Gallbladder sludge.  No shadowing stones. Electronically Signed   By: Karen Kays M.D.   On: 02/27/2023 14:46   CT Soft Tissue Neck W Contrast Result Date: 02/26/2023 CLINICAL DATA:  Initial evaluation for soft tissue infection suspected. History of thyroid cancer. EXAM: CT NECK WITH CONTRAST TECHNIQUE: Multidetector CT imaging of the neck was performed using the standard protocol following the bolus administration of intravenous contrast. RADIATION DOSE REDUCTION: This exam was performed according to the  departmental dose-optimization program which includes automated exposure control, adjustment of the mA and/or kV according to patient size and/or use of iterative reconstruction technique. CONTRAST:  OMNIPAQUE IOHEXOL 300 MG/ML  SOLN COMPARISON:  None Available. FINDINGS: Pharynx and larynx: Evaluation of the oral cavity limited by streak artifact from dental amalgam. No visible abnormality about the dentition. Oropharynx and nasopharynx within normal limits. No retropharyngeal collection or swelling. Negative epiglottis. Hypopharynx and supraglottic larynx within normal limits. Negative glottis. Subglottic airway clear. Salivary glands: Parotid and submandibular glands within normal limits. Thyroid: Markedly enlarged heterogeneous multinodular thyroid. Largest nodule on the left measures up to 4.8 cm. Heterogeneous enhancing nodular density with central hypodensity involving the subcutaneous fat overlies the thyroid at the left lower anterior neck, measuring up to 2.9 cm (series 5, image 70). Findings indeterminate, but suspected to be thyroidal in origin, possibly a nodal metastasis. Lymph nodes: No other pathologically enlarged lymph nodes  within the neck. Vascular: Normal intravascular enhancement seen within the neck. Limited intracranial: Unremarkable. Visualized orbits: Prior ocular lens replacement. Otherwise unremarkable. Mastoids and visualized paranasal sinuses: Paranasal sinuses are clear. Visualized mastoids and middle ear cavities are clear as well. Skeleton: No discrete or worrisome osseous lesions. Upper chest: No other acute finding. Other: None. IMPRESSION: 1. Markedly enlarged heterogeneous multinodular thyroid, with largest nodule on the left measuring up to 4.8 cm. Further evaluation with dedicated thyroid ultrasound recommended if not already performed. (Ref: J Am Coll Radiol. 2015 Feb;12(2): 143-50). 2. 2.9 cm heterogeneous enhancing nodule involving the subcutaneous fat overlying the  thyroid at the left lower neck. Finding is indeterminate, but suspected to be thyroidal in origin, possibly a nodal metastasis. 3. No other acute abnormality within the neck. Electronically Signed   By: Rise Mu M.D.   On: 02/26/2023 22:42   CT ABDOMEN PELVIS W CONTRAST Result Date: 02/26/2023 CLINICAL DATA:  History of thyroid cancer. Weakness since biopsy 2 weeks ago. Abdominal pain. EXAM: CT ABDOMEN AND PELVIS WITH CONTRAST TECHNIQUE: Multidetector CT imaging of the abdomen and pelvis was performed using the standard protocol following bolus administration of intravenous contrast. RADIATION DOSE REDUCTION: This exam was performed according to the departmental dose-optimization program which includes automated exposure control, adjustment of the mA and/or kV according to patient size and/or use of iterative reconstruction technique. CONTRAST:  OMNIPAQUE IOHEXOL 300 MG/ML  SOLN COMPARISON:  None Available. FINDINGS: Lower chest: No acute abnormality. Hepatobiliary: No focal hepatic lesion. Mild intra and extrahepatic biliary dilation. The common bile duct measures 12 mm in diameter. Small amount of layering sludge in the gallbladder. Pancreas: Unremarkable. Spleen: Unremarkable. Adrenals/Urinary Tract: Normal adrenal glands. No urinary calculi or hydronephrosis. The bladder is obscured by streak artifact. Stomach/Bowel: Normal caliber large and small bowel without bowel wall thickening. Stomach and appendix are within normal limits. Vascular/Lymphatic: Aortic atherosclerosis. No enlarged abdominal or pelvic lymph nodes. Reproductive: Hysterectomy. Other: No free intraperitoneal fluid or air. Musculoskeletal: Left THA.  No acute fracture. IMPRESSION: 1. No acute abnormality in the abdomen or pelvis. 2. Mild intra and extrahepatic biliary dilation. Correlate with LFTs. If abnormal consider MRCP or ERCP for further evaluation. Aortic Atherosclerosis (ICD10-I70.0). Electronically Signed   By: Minerva Fester M.D.   On: 02/26/2023 22:29   DG Chest Port 1 View Result Date: 02/26/2023 CLINICAL DATA:  Questionable sepsis EXAM: PORTABLE CHEST 1 VIEW COMPARISON:  10/18/2013 FINDINGS: Heart is borderline in size. Mediastinal contours within normal limits. No confluent opacities, effusions or edema. No acute bony abnormality. IMPRESSION: No active disease. Electronically Signed   By: Charlett Nose M.D.   On: 02/26/2023 22:20    Microbiology: Results for orders placed or performed during the hospital encounter of 02/26/23  Blood Culture (routine x 2)     Status: None   Collection Time: 02/26/23  9:30 PM   Specimen: BLOOD  Result Value Ref Range Status   Specimen Description BLOOD BLOOD LEFT ARM  Final   Special Requests   Final    BOTTLES DRAWN AEROBIC AND ANAEROBIC Blood Culture adequate volume   Culture   Final    NO GROWTH 5 DAYS Performed at [facility], [address], [place], [place] [address]    Report Status 03/03/2023 FINAL  Final  Blood Culture (routine x 2)     Status: None   Collection Time: 02/26/23  9:31 PM   Specimen: BLOOD RIGHT HAND  Result Value Ref Range Status   Specimen Description  Final    BLOOD RIGHT HAND BOTTLES DRAWN AEROBIC AND ANAEROBIC   Special Requests Blood Culture adequate volume  Final   Culture   Final    NO GROWTH 5 DAYS Performed at [facility], [address], [place], [place] [address]    Report Status 03/03/2023 FINAL  Final  C Difficile Quick Screen w PCR reflex     Status: Abnormal   Collection Time: 02/27/23  6:40 AM   Specimen: STOOL  Result Value Ref Range Status   C Diff antigen POSITIVE (A) NEGATIVE Final   C Diff toxin NEGATIVE NEGATIVE Final   C Diff interpretation Results are indeterminate. See PCR results.  Final    Comment: Performed at [facility], [address] [place], [place] [address]  Gastrointestinal Panel by PCR , Stool     Status: None   Collection Time: 02/27/23  6:40 AM   Specimen: STOOL  Result Value Ref Range  Status   Campylobacter species NOT DETECTED NOT DETECTED Final   Plesimonas shigelloides NOT DETECTED NOT DETECTED Final   Salmonella species NOT DETECTED NOT DETECTED Final   Yersinia enterocolitica NOT DETECTED NOT DETECTED Final   Vibrio species NOT DETECTED NOT DETECTED Final   Vibrio cholerae NOT DETECTED NOT DETECTED Final   Enteroaggregative E coli (EAEC) NOT DETECTED NOT DETECTED Final   Enteropathogenic E coli (EPEC) NOT DETECTED NOT DETECTED Final   Enterotoxigenic E coli (ETEC) NOT DETECTED NOT DETECTED Final   Shiga like toxin producing E coli (STEC) NOT DETECTED NOT DETECTED Final   Shigella/Enteroinvasive E coli (EIEC) NOT DETECTED NOT DETECTED Final   Cryptosporidium NOT DETECTED NOT DETECTED Final   Cyclospora cayetanensis NOT DETECTED NOT DETECTED Final   Entamoeba histolytica NOT DETECTED NOT DETECTED Final   Giardia lamblia NOT DETECTED NOT DETECTED Final   Adenovirus F40/41 NOT DETECTED NOT DETECTED Final   Astrovirus NOT DETECTED NOT DETECTED Final   Norovirus GI/GII NOT DETECTED NOT DETECTED Final   Rotavirus A NOT DETECTED NOT DETECTED Final   Sapovirus (I, II, IV, and V) NOT DETECTED NOT DETECTED Final    Comment: Performed at [facility], [address] [place], [place] [address]  C. Diff by PCR, Reflexed     Status: Abnormal   Collection Time: 02/27/23  6:40 AM  Result Value Ref Range Status   Toxigenic C. Difficile by PCR POSITIVE (A) NEGATIVE Final    Comment: Positive for toxigenic C. difficile with little to no toxin production. Only treat if clinical presentation suggests symptomatic illness. Performed at [facility], [address], [place], [place] [address]   SARS Coronavirus 2 by RT PCR (hospital order, performed in [facility] hospital lab) *cepheid single result test* Anterior Nasal Swab     Status: None   Collection Time: 02/27/23 11:00 AM   Specimen: Anterior Nasal Swab  Result Value Ref Range Status   SARS Coronavirus 2 by RT  PCR NEGATIVE NEGATIVE Final    Comment: (NOTE) SARS-CoV-2 target nucleic acids are NOT DETECTED.  The SARS-CoV-2 RNA is generally detectable in upper and lower respiratory specimens during the acute phase of infection. The lowest concentration of SARS-CoV-2 viral copies this assay can detect is 250 copies / mL. A negative result does not preclude SARS-CoV-2 infection and should not be used as the sole basis for treatment or other patient management decisions.  A negative result may occur with improper specimen collection / handling, submission of specimen other than nasopharyngeal swab, presence of  viral mutation(s) within the areas targeted by this assay, and inadequate number of viral copies (<250 copies / mL). A negative result must be combined with clinical observations, patient history, and epidemiological information.  Fact Sheet for Patients:   RoadLapTop.co.za  Fact Sheet for Healthcare Providers: http://kim-miller.com/  This test is not yet approved or  cleared by the [place] FDA and has been authorized for detection and/or diagnosis of SARS-CoV-2 by FDA under an Emergency Use Authorization (EUA).  This EUA will remain in effect (meaning this test can be used) for the duration of the COVID-19 declaration under Section 564(b)(1) of the Act, 21 U.S.C. section 360bbb-3(b)(1), unless the authorization is terminated or revoked sooner.  Performed at [facility], [address], [place], [place] [address]   Urine Culture     Status: None   Collection Time: 02/27/23  1:40 PM   Specimen: Urine, Random  Result Value Ref Range Status   Specimen Description   Final    URINE, RANDOM Performed at [facility], [address] [place], [place] [address]    Special Requests   Final    NONE Reflexed from [id number] Performed at [facility], [address], [place], [place] [address]    Culture   Final    NO GROWTH Performed at [facility], [address] [place], [place] [address]    Report Status 03/01/2023 FINAL  Final    Labs: CBC: Recent Labs  Lab 02/26/23 2108 02/27/23 0701 02/28/23 0458 03/01/23 0432 03/02/23 0408 03/03/23 0525 03/04/23 0047  WBC 17.7*   < > 11.8* 12.8* 11.4* 13.0* 16.0*  NEUTROABS 14.1*  --   --   --   --   --   --   HGB 9.8*   < > 10.1* 9.4* 9.6* 9.7* 12.4  HCT 31.9*   < > 32.4* 30.2* 29.8* 30.5* 38.8  MCV 93.5   < > 88.8 89.9 90.6 88.7 91.3  PLT 319   < > 168 137* 139* 139* 189   < > = values in this interval not displayed.   Basic Metabolic Panel: Recent Labs  Lab 02/27/23 0357 02/28/23 0458 03/01/23 0432 03/02/23 0408 03/03/23 0525 03/04/23 0047 03/04/23 0822 03/05/23 0433  NA 136 143 143 141 138 140  --  138  K 3.4* 3.7 3.7 3.3* 3.8 3.4*  --  4.0  CL 104 111 110 108 107 106  --  110  CO2 22 22 24 25 23 23   --  21*  GLUCOSE 88 107* 95 89 97 87  --  107*  BUN 49* 33* 24* 16 14 11   --  11  CREATININE 0.95 0.61 0.55 0.56 0.58 0.58  --  0.54  CALCIUM 9.8 10.2 10.0 10.1 10.2 10.9*  --  11.0*  MG 1.4* 1.9  --  1.2*  --  1.3* 1.9  --   PHOS 3.5  --   --   --   --   --   --   --    Liver Function Tests: Recent Labs  Lab 02/28/23 0458 03/01/23 0432 03/02/23 0408 03/03/23 0525 03/04/23 0047  AST 29 29 48* 42* 44*  ALT 33 34 48* 47* 51*  ALKPHOS 58 63 142* 119 128*  BILITOT 1.8* 1.5* 1.3* 1.1 0.9  PROT 5.2* 5.0* 4.9* 5.2* 5.7*  ALBUMIN 2.5* 2.3* 2.2* 2.2* 2.6*   CBG: No results for input(s): "GLUCAP" in the last 168 hours.  Discharge time spent: greater than  30 minutes.  Signed: Kendell Bane, MD [facility] 03/05/2023

## 2023-03-05 NOTE — Progress Notes (Signed)
Mobility Specialist Progress Note:    03/05/23 1224  Mobility  Activity Transferred from bed to chair;Stood at bedside;Dangled on edge of bed  Level of Assistance Maximum assist, patient does 25-49%  Assistive Device Front wheel walker  Distance Ambulated (ft) 4 ft  Range of Motion/Exercises Active;All extremities  Activity Response Tolerated well  Mobility Referral Yes  Mobility visit 1 Mobility  Mobility Specialist Start Time (ACUTE ONLY) 1145  Mobility Specialist Stop Time (ACUTE ONLY) 1215  Mobility Specialist Time Calculation (min) (ACUTE ONLY) 30 min   Pt received in bed, agreeable to mobility.Nurse at bedside to assist with peri care and transfer. Required MaxA to stand and transfer with RW. Tolerated well,asx throughout, Left pt in chair,alarm on. Nurse in room,all needs met.   Lawerance Bach Mobility Specialist Please contact via Special educational needs teacher or  Rehab office at [phone]

## 2023-03-05 NOTE — Care Management Important Message (Signed)
Important Message  Patient Details  Name: Carrie Fitzgerald MRN: [id number] Date of Birth: Dec 02, 1945   Important Message Given:  Yes - Medicare IM     Corey Harold 03/05/2023, 10:31 AM

## 2023-03-05 NOTE — TOC Progression Note (Signed)
Transition of Care [facility]) - Progression Note    Patient Details  Name: Carrie Fitzgerald MRN: [id number] Date of Birth: Sep 30, 1945  Transition of Care [facility] CM/SW Contact  Villa Herb, [place] Phone Number: 03/05/2023, 11:18 AM  Clinical Narrative:    CSW updated by Kyung Rudd in admissions at [facility] that pts insurance [employer] Harvey is still pending at this time. TOC to follow.   Expected Discharge Plan: Skilled Nursing Facility Barriers to Discharge: Insurance Authorization  Expected Discharge Plan and Services In-house Referral: Clinical Social Work Discharge Planning Services: CM Consult Post Acute Care Choice: Skilled Nursing Facility Living arrangements for the past 2 months: Single Family Home                                       Social Determinants of Health (SDOH) Interventions SDOH Screenings   Food Insecurity: No Food Insecurity (02/27/2023)  Housing: Unknown (02/27/2023)  Transportation Needs: No Transportation Needs (02/27/2023)  Utilities: Not At Risk (02/27/2023)  Social Connections: Unknown (02/28/2023)  Tobacco Use: Low Risk  (02/27/2023)    Readmission Risk Interventions    02/27/2023   12:39 PM  Readmission Risk Prevention Plan  Medication Screening Complete  Transportation Screening Complete

## 2023-03-06 DIAGNOSIS — R531 Weakness: Secondary | ICD-10-CM | POA: Diagnosis not present

## 2023-03-06 LAB — BASIC METABOLIC PANEL
Anion gap: 6 (ref 5–15)
BUN: 13 mg/dL (ref 8–23)
CO2: 21 mmol/L — ABNORMAL LOW (ref 22–32)
Calcium: 11 mg/dL — ABNORMAL HIGH (ref 8.9–10.3)
Chloride: 109 mmol/L (ref 98–111)
Creatinine, Ser: 0.58 mg/dL (ref 0.44–1.00)
GFR, Estimated: >60 mL/min (ref >60)
Glucose, Bld: 105 mg/dL — ABNORMAL HIGH (ref 70–99)
Potassium: 3.9 mmol/L (ref 3.5–5.1)
Sodium: 136 mmol/L (ref 135–145)

## 2023-03-06 MED ORDER — GERHARDT'S BUTT CREAM: 1.0000 | TOPICAL_CREAM | Freq: Four times a day (QID) | CUTANEOUS | 0 refills | Status: DC

## 2023-03-06 NOTE — Discharge Summary (Addendum)
Physician Discharge Summary   Patient: Carrie Fitzgerald MRN: [id number] DOB: 12/02/1945  Admit date:     02/26/2023  Discharge date: 03/06/23  Discharge Physician: Kendell Bane   PCP: Harley Hallmark, NP   The patient was seen and examined, patient is medically stable to be discharged to SNF.     recommendations at discharge:    Follow with the PCP in 2-4 weeks -Continue current recommended medications -Medications are subject to change by PCP -Continue PT OT, fall precautions  Discharge Diagnoses: Principal Problem:   Generalized weakness Active Problems:   Mixed hyperlipidemia   Thyroid malignant neoplasm (HCC)   Multinodular goiter   Failure to thrive in adult   Pressure injury of skin   Hypoalbuminemia due to protein-calorie malnutrition (HCC)   Leukocytosis   Hypercalcemia   Essential hypertension   Sacral decubitus ulcer, stage II (HCC)   SIRS (systemic inflammatory response syndrome) (HCC)   C. difficile colitis   Sepsis due to undetermined organism (HCC)   AKI (acute kidney injury) [facility])    Hospital Course: Carrie Fitzgerald is a 78 year old female with a history of multinodular goiter, suspected thyroid malignancy, ovarian cancer, vitamin D deficiency, rheumatoid arthritis, hypertension presenting with generalized weakness.  The patient is a difficult historian.  History is supplemented by review of the medical record.  The patient had biopsy of her thyroid on 01/21/2023.  She followed up with her endocrinologist, Dr. Fransico Him on 02/10/2023.  At that time, the pathology from her biopsy was reviewed.  The cytology showed atypia of undetermined significance.  A sample was sent for Afrima testing which revealed 50% risk of malignancy.  There were also TERT positive findings of evidence of 90% risk of malignancy.  The patient was referred to Dr. Darnell Level for thyroidectomy.  She is rescheduled to follow-up with Dr. Fransico Him on 04/02/2023.  In the interim, the patient states  that she has had some difficulty swallowing some solid foods and pills.  She has not had any difficulty swallowing liquids.  She denies any pain in her neck.  She denies any drainage or open wounds in her neck.  There is not been any worsening swelling or erythema in her neck.  She denies any fevers, chills, headache, chest pain, shortness breath, coughing, hemoptysis.  She did have 1 episode of nausea and vomiting 1 day prior to admission.  She has had some loose stools without hematochezia or melena.  She denies any abdominal pain.  There is no dysuria or hematuria. The patient presented because of decreased oral intake and increasing generalized weakness. In the ED, the patient was afebrile with soft blood pressures down to 87/45.  She was tachycardic up to 111.  WBC 17.7, hemoglobin 9.8, platelets 319.  Sodium 140, potassium 4.1, bicarbonate 22, serum creatinine 1.20.  The patient was started on IV fluids.  Blood cultures were obtained.  She was started on empiric antibiotics.  Once urine cultures and blood cultures were negative, vancomycin and cefepime were discontinued.  Her C. difficile antigen and PCR were positive.  In the setting of abdominal pain, leukocytosis, and diarrhea, vancomycin po was started with improvement of her abdominal pain and loose stools.  Sepsis-likely due to C. difficile colitis -Resolved sepsis physiology   -Sepsis present on admission   -Lactic acid 1.6 -Obtain UA 11-20 WBC -Chest x-ray without consolidation - COVID-19 PCR--neg -Follow blood cultures--neg to date - procalcitonin 0.18 -Empiric antibiotics pending culture data>>d/c abx 1/26   Generalized weakness -Multifactorial including dehydration,  possible infection, and possible hypothyroidism -B12--1081 -Folic acid>40 -TSH <0.010 -PT evaluation>>SNF -UA 11-20 WBC   Pyuria -continue empiric abx pending culture data -1/26--d/c vanc and cefepime--cultures neg   Tachycardia/SVT -Much improved -During  this admission was treated IV fluids, -personally reviewed EKG-- -increase metoprolol succinate dose to 50 mg   Cdiff colitis -Continue to have diarrhea but improved episodes -c diff toxin neg, C diff PCR positive -present at time of admission -pt with 6-7 loose stools daily, leukocytosis and abdominal pain -start po vanco 03/01/23   AKI -baseline creatinine 0.6-0.9 -presented with serum creatinine 1.20 -continue IVF>>improved   Hyperthyroidism/multinodular goiter -Check free T4--5.49 -Check free T3--18.6 -TSH <0.010 -02/26/2023 CT neck soft tissues--oropharynx and nasopharynx WNL; no retropharyngeal collection or swelling.  Normal epiglottis.  Hypopharynx and supraglottic larynx WNL.  Enlarged heterogenous multinodular thyroid with largest nodule 4.8 cm. 2.9 cm heterogeneous enhancing nodule involving the subcutaneous fat overlying the thyroid at the left lower neck -02/27/23--case discussed with Dr. Derwood Kaplan to start methimazole   Hypercalcemia -Corrected calcium 11.7 at the time of admission -Likely related to abnormal thyroid functions and thyroid cancer -Continue IV fluids -Intact PTH--14 -S/p  IV fluids   Essential hypertension -was Holding metoprolol succinate and HCTZ secondary to soft blood pressures initially -restart metoprolol succinate>>increase to 50 mg daily   Mixed hyperlipidemia -Continue statin   Sacral decubitus ulcer stage II -Local wound care>>Gerheardt's Butt paste   Hypomagnesemia/Hypokalemia -replete   Hyperbilirubinemia -02/27/23 RUQ US--GB sludge, no biliary ductal dilatation -fractionate bili -check LDH--126 -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's -improved   Right leg edema -venous duplex       Pain control - [place] Controlled Substance Reporting System database was reviewed. and patient was instructed, not to drive, operate heavy machinery, perform activities at heights, swimming or participation in water activities or provide baby-sitting services  while on Pain, Sleep and Anxiety Medications; until their outpatient Physician has advised to do so again. Also recommended to not to take more than prescribed Pain, Sleep and Anxiety Medications.    Disposition: Skilled nursing facility Diet recommendation:  Discharge Diet Orders (From admission, onward)     Start     Ordered   03/05/23 0000  Diet - low sodium heart healthy        03/05/23 1158           Regular diet DISCHARGE MEDICATION: Allergies as of 03/06/2023   No Known Allergies      Medication List     PAUSE taking these medications    methotrexate 2.5 MG tablet Wait to take this until: April 02, 2023 Commonly known as: RHEUMATREX Take 10 mg by mouth every Thursday. Caution:Chemotherapy. Protect from light.       STOP taking these medications    folic acid 1 MG tablet Commonly known as: FOLVITE   hydrochlorothiazide 50 MG tablet Commonly known as: HYDRODIURIL   rosuvastatin 5 MG tablet Commonly known as: Crestor       TAKE these medications    acidophilus Caps capsule Take 2 capsules by mouth 3 (three) times daily for 14 days.   Calcium 600+D 600-400 MG-UNIT tablet Generic drug: Calcium Carbonate-Vitamin D Take 1 tablet by mouth daily.   liver oil-zinc oxide 40 % ointment Commonly known as: DESITIN Apply topically every 6 (six) hours.   metoprolol succinate 50 MG 24 hr tablet Commonly known as: TOPROL-XL Take 1 tablet by mouth daily.   ondansetron 4 MG tablet Commonly known as: ZOFRAN Take 1 tablet (4 mg total) by  mouth every 6 (six) hours as needed for nausea.   potassium chloride SA 20 MEQ tablet Commonly known as: KLOR-CON M Take 20 mEq by mouth daily.   traMADol 50 MG tablet Commonly known as: ULTRAM Take 1 tablet (50 mg total) by mouth every morning for 7 days.   vancomycin 125 MG capsule Commonly known as: VANCOCIN Take 1 capsule (125 mg total) by mouth 4 (four) times daily.               Discharge Care  Instructions  (From admission, onward)           Start     Ordered   03/06/23 0000  Discharge wound care:       Comments: Per nursing instructions   03/06/23 1523   03/05/23 0000  Discharge wound care:       Comments: Wound care for right buttock stage II pressure ulcers early stage Reposition every 2 hours in bed   03/05/23 1158            Contact information for after-discharge care     Destination     [facility] .   Service: Skilled Nursing Contact information: [address] [place] [address] [phone]                    Discharge Exam: Ceasar Mons Weights   02/26/23 2052 02/27/23 0041  Weight: 65.6 kg 63.3 kg        General:  AAO x 3,  cooperative, no distress;   HEENT:  Normocephalic, PERRL, otherwise with in Normal limits   Neuro:  CNII-XII intact. , normal motor and sensation, reflexes intact   Lungs:   Clear to auscultation BL, Respirations unlabored,  No wheezes / crackles  Cardio:    S1/S2, RRR, No murmure, No Rubs or Gallops   Abdomen:  Soft, non-tender, bowel sounds active all four quadrants, no guarding or peritoneal signs.  Muscular  skeletal:  Limited exam -global generalized weaknesses - in bed, able to move all 4 extremities,   2+ pulses,  symmetric, No pitting edema  Skin:  Dry, warm to touch, negative for any Rashes,  Wounds: Please see nursing documentation  Pressure Injury 02/27/23 Buttocks Right Stage 2 -  Partial thickness loss of dermis presenting as a shallow open injury with a red, pink wound bed without slough. Non-intact skin; pinkish/red wound base; no drainage (Active)  02/27/23 0101  Location: Buttocks  Location Orientation: Right  Staging: Stage 2 -  Partial thickness loss of dermis presenting as a shallow open injury with a red, pink wound bed without slough.  Wound Description (Comments): Non-intact skin; pinkish/red wound base; no drainage  Present on Admission: Yes  Dressing  Type Foam - Lift dressing to assess site every shift 03/04/23 0900     Pressure Injury 02/27/23 Right Stage 2 -  Partial thickness loss of dermis presenting as a shallow open injury with a red, pink wound bed without slough. Non-intact skin; pinkish/red wound bed; no drainage (Active)  02/27/23 0103  Location:   Location Orientation: Right  Staging: Stage 2 -  Partial thickness loss of dermis presenting as a shallow open injury with a red, pink wound bed without slough.  Wound Description (Comments): Non-intact skin; pinkish/red wound bed; no drainage  Present on Admission: Yes  Dressing Type Foam - Lift dressing to assess site every shift 03/03/23 2020           Condition at discharge:  good  The results of significant diagnostics from this hospitalization (including imaging, microbiology, ancillary and laboratory) are listed below for reference.   Imaging Studies: US Venous Img Lower Unilateral Right (DVT) Result Date: 03/04/2023 CLINICAL DATA:  Right lower extremity pain.  Evaluate for DVT. EXAM: RIGHT LOWER EXTREMITY VENOUS DOPPLER ULTRASOUND TECHNIQUE: Gray-scale sonography with graded compression, as well as color Doppler and duplex ultrasound were performed to evaluate the lower extremity deep venous systems from the level of the common femoral vein and including the common femoral, femoral, profunda femoral, popliteal and calf veins including the posterior tibial, peroneal and gastrocnemius veins when visible. The superficial great saphenous vein was also interrogated. Spectral Doppler was utilized to evaluate flow at rest and with distal augmentation maneuvers in the common femoral, femoral and popliteal veins. COMPARISON:  None Available. FINDINGS: Contralateral Common Femoral Vein: Respiratory phasicity is normal and symmetric with the symptomatic side. No evidence of thrombus. Normal compressibility. Common Femoral Vein: No evidence of thrombus. Normal compressibility, respiratory  phasicity and response to augmentation. Saphenofemoral Junction: No evidence of thrombus. Normal compressibility and flow on color Doppler imaging. Profunda Femoral Vein: No evidence of thrombus. Normal compressibility and flow on color Doppler imaging. Femoral Vein: No evidence of thrombus. Normal compressibility, respiratory phasicity and response to augmentation. Popliteal Vein: There is mixed echogenic nonocclusive wall thickening/DVT within the right popliteal vein (images 26 and 27) Calf Veins: No evidence of thrombus. Normal compressibility and flow on color Doppler imaging. Superficial Great Saphenous Vein: No evidence of thrombus. Normal compressibility. Other Findings:  None. IMPRESSION: 1. No evidence of occlusive DVT within the right lower extremity. 2. Mixed echogenic nonocclusive wall thickening/DVT within the right popliteal vein, favored to be chronic in etiology though technically age-indeterminate in the absence of prior examinations. Electronically Signed   By: Simonne Come M.D.   On: 03/04/2023 11:01   US Abdomen Limited RUQ (LIVER/GB) Result Date: 02/27/2023 CLINICAL DATA:  Hyperbilirubinemia EXAM: ULTRASOUND ABDOMEN LIMITED RIGHT UPPER QUADRANT COMPARISON:  CT 02/26/2023 FINDINGS: Gallbladder: Gallbladder is distended. There is some layering sludge. No shadowing stones. No adjacent fluid. Common bile duct: Diameter: 6 mm Liver: No focal lesion identified. Within normal limits in parenchymal echogenicity. Portal vein is patent on color Doppler imaging with normal direction of blood flow towards the liver. Other: None. IMPRESSION: No biliary dilatation.  Gallbladder sludge.  No shadowing stones. Electronically Signed   By: Karen Kays M.D.   On: 02/27/2023 14:46   CT Soft Tissue Neck W Contrast Result Date: 02/26/2023 CLINICAL DATA:  Initial evaluation for soft tissue infection suspected. History of thyroid cancer. EXAM: CT NECK WITH CONTRAST TECHNIQUE: Multidetector CT imaging of the neck  was performed using the standard protocol following the bolus administration of intravenous contrast. RADIATION DOSE REDUCTION: This exam was performed according to the departmental dose-optimization program which includes automated exposure control, adjustment of the mA and/or kV according to patient size and/or use of iterative reconstruction technique. CONTRAST:  OMNIPAQUE IOHEXOL 300 MG/ML  SOLN COMPARISON:  None Available. FINDINGS: Pharynx and larynx: Evaluation of the oral cavity limited by streak artifact from dental amalgam. No visible abnormality about the dentition. Oropharynx and nasopharynx within normal limits. No retropharyngeal collection or swelling. Negative epiglottis. Hypopharynx and supraglottic larynx within normal limits. Negative glottis. Subglottic airway clear. Salivary glands: Parotid and submandibular glands within normal limits. Thyroid: Markedly enlarged heterogeneous multinodular thyroid. Largest nodule on the left measures up to 4.8 cm. Heterogeneous enhancing nodular density with central hypodensity involving the  subcutaneous fat overlies the thyroid at the left lower anterior neck, measuring up to 2.9 cm (series 5, image 70). Findings indeterminate, but suspected to be thyroidal in origin, possibly a nodal metastasis. Lymph nodes: No other pathologically enlarged lymph nodes within the neck. Vascular: Normal intravascular enhancement seen within the neck. Limited intracranial: Unremarkable. Visualized orbits: Prior ocular lens replacement. Otherwise unremarkable. Mastoids and visualized paranasal sinuses: Paranasal sinuses are clear. Visualized mastoids and middle ear cavities are clear as well. Skeleton: No discrete or worrisome osseous lesions. Upper chest: No other acute finding. Other: None. IMPRESSION: 1. Markedly enlarged heterogeneous multinodular thyroid, with largest nodule on the left measuring up to 4.8 cm. Further evaluation with dedicated thyroid ultrasound  recommended if not already performed. (Ref: J Am Coll Radiol. 2015 Feb;12(2): 143-50). 2. 2.9 cm heterogeneous enhancing nodule involving the subcutaneous fat overlying the thyroid at the left lower neck. Finding is indeterminate, but suspected to be thyroidal in origin, possibly a nodal metastasis. 3. No other acute abnormality within the neck. Electronically Signed   By: Rise Mu M.D.   On: 02/26/2023 22:42   CT ABDOMEN PELVIS W CONTRAST Result Date: 02/26/2023 CLINICAL DATA:  History of thyroid cancer. Weakness since biopsy 2 weeks ago. Abdominal pain. EXAM: CT ABDOMEN AND PELVIS WITH CONTRAST TECHNIQUE: Multidetector CT imaging of the abdomen and pelvis was performed using the standard protocol following bolus administration of intravenous contrast. RADIATION DOSE REDUCTION: This exam was performed according to the departmental dose-optimization program which includes automated exposure control, adjustment of the mA and/or kV according to patient size and/or use of iterative reconstruction technique. CONTRAST:  OMNIPAQUE IOHEXOL 300 MG/ML  SOLN COMPARISON:  None Available. FINDINGS: Lower chest: No acute abnormality. Hepatobiliary: No focal hepatic lesion. Mild intra and extrahepatic biliary dilation. The common bile duct measures 12 mm in diameter. Small amount of layering sludge in the gallbladder. Pancreas: Unremarkable. Spleen: Unremarkable. Adrenals/Urinary Tract: Normal adrenal glands. No urinary calculi or hydronephrosis. The bladder is obscured by streak artifact. Stomach/Bowel: Normal caliber large and small bowel without bowel wall thickening. Stomach and appendix are within normal limits. Vascular/Lymphatic: Aortic atherosclerosis. No enlarged abdominal or pelvic lymph nodes. Reproductive: Hysterectomy. Other: No free intraperitoneal fluid or air. Musculoskeletal: Left THA.  No acute fracture. IMPRESSION: 1. No acute abnormality in the abdomen or pelvis. 2. Mild intra and  extrahepatic biliary dilation. Correlate with LFTs. If abnormal consider MRCP or ERCP for further evaluation. Aortic Atherosclerosis (ICD10-I70.0). Electronically Signed   By: Minerva Fester M.D.   On: 02/26/2023 22:29   DG Chest Port 1 View Result Date: 02/26/2023 CLINICAL DATA:  Questionable sepsis EXAM: PORTABLE CHEST 1 VIEW COMPARISON:  10/18/2013 FINDINGS: Heart is borderline in size. Mediastinal contours within normal limits. No confluent opacities, effusions or edema. No acute bony abnormality. IMPRESSION: No active disease. Electronically Signed   By: Charlett Nose M.D.   On: 02/26/2023 22:20    Microbiology: Results for orders placed or performed during the hospital encounter of 02/26/23  Blood Culture (routine x 2)     Status: None   Collection Time: 02/26/23  9:30 PM   Specimen: BLOOD  Result Value Ref Range Status   Specimen Description BLOOD BLOOD LEFT ARM  Final   Special Requests   Final    BOTTLES DRAWN AEROBIC AND ANAEROBIC Blood Culture adequate volume   Culture   Final    NO GROWTH 5 DAYS Performed at [facility], [address] [place], [place] [address]    Report  Status 03/03/2023 FINAL  Final  Blood Culture (routine x 2)     Status: None   Collection Time: 02/26/23  9:31 PM   Specimen: BLOOD RIGHT HAND  Result Value Ref Range Status   Specimen Description   Final    BLOOD RIGHT HAND BOTTLES DRAWN AEROBIC AND ANAEROBIC   Special Requests Blood Culture adequate volume  Final   Culture   Final    NO GROWTH 5 DAYS Performed at [facility], [address], [place], [place] [address]    Report Status 03/03/2023 FINAL  Final  C Difficile Quick Screen w PCR reflex     Status: Abnormal   Collection Time: 02/27/23  6:40 AM   Specimen: STOOL  Result Value Ref Range Status   C Diff antigen POSITIVE (A) NEGATIVE Final   C Diff toxin NEGATIVE NEGATIVE Final   C Diff interpretation Results are indeterminate. See PCR results.  Final    Comment: Performed at [facility], [address] [place], [place] [address]  Gastrointestinal Panel by PCR , Stool     Status: None   Collection Time: 02/27/23  6:40 AM   Specimen: STOOL  Result Value Ref Range Status   Campylobacter species NOT DETECTED NOT DETECTED Final   Plesimonas shigelloides NOT DETECTED NOT DETECTED Final   Salmonella species NOT DETECTED NOT DETECTED Final   Yersinia enterocolitica NOT DETECTED NOT DETECTED Final   Vibrio species NOT DETECTED NOT DETECTED Final   Vibrio cholerae NOT DETECTED NOT DETECTED Final   Enteroaggregative E coli (EAEC) NOT DETECTED NOT DETECTED Final   Enteropathogenic E coli (EPEC) NOT DETECTED NOT DETECTED Final   Enterotoxigenic E coli (ETEC) NOT DETECTED NOT DETECTED Final   Shiga like toxin producing E coli (STEC) NOT DETECTED NOT DETECTED Final   Shigella/Enteroinvasive E coli (EIEC) NOT DETECTED NOT DETECTED Final   Cryptosporidium NOT DETECTED NOT DETECTED Final   Cyclospora cayetanensis NOT DETECTED NOT DETECTED Final   Entamoeba histolytica NOT DETECTED NOT DETECTED Final   Giardia lamblia NOT DETECTED NOT DETECTED Final   Adenovirus F40/41 NOT DETECTED NOT DETECTED Final   Astrovirus NOT DETECTED NOT DETECTED Final   Norovirus GI/GII NOT DETECTED NOT DETECTED Final   Rotavirus A NOT DETECTED NOT DETECTED Final   Sapovirus (I, II, IV, and V) NOT DETECTED NOT DETECTED Final    Comment: Performed at [facility], [address] [place], [place] [address]  C. Diff by PCR, Reflexed     Status: Abnormal   Collection Time: 02/27/23  6:40 AM  Result Value Ref Range Status   Toxigenic C. Difficile by PCR POSITIVE (A) NEGATIVE Final    Comment: Positive for toxigenic C. difficile with little to no toxin production. Only treat if clinical presentation suggests symptomatic illness. Performed at [facility], [address] [place], [place] [address]   SARS Coronavirus 2 by RT PCR (hospital order, performed in [facility] hospital lab) *cepheid  single result test* Anterior Nasal Swab     Status: None   Collection Time: 02/27/23 11:00 AM   Specimen: Anterior Nasal Swab  Result Value Ref Range Status   SARS Coronavirus 2 by RT PCR NEGATIVE NEGATIVE Final    Comment: (NOTE) SARS-CoV-2 target nucleic acids are NOT DETECTED.  The SARS-CoV-2 RNA is generally detectable in upper and lower respiratory specimens during the acute phase of infection. The lowest concentration of SARS-CoV-2 viral copies this assay can detect is 250 copies / mL. A negative  result does not preclude SARS-CoV-2 infection and should not be used as the sole basis for treatment or other patient management decisions.  A negative result may occur with improper specimen collection / handling, submission of specimen other than nasopharyngeal swab, presence of viral mutation(s) within the areas targeted by this assay, and inadequate number of viral copies (<250 copies / mL). A negative result must be combined with clinical observations, patient history, and epidemiological information.  Fact Sheet for Patients:   RoadLapTop.co.za  Fact Sheet for Healthcare Providers: http://kim-miller.com/  This test is not yet approved or  cleared by the [place] FDA and has been authorized for detection and/or diagnosis of SARS-CoV-2 by FDA under an Emergency Use Authorization (EUA).  This EUA will remain in effect (meaning this test can be used) for the duration of the COVID-19 declaration under Section 564(b)(1) of the Act, 21 U.S.C. section 360bbb-3(b)(1), unless the authorization is terminated or revoked sooner.  Performed at [facility], [address], [place], [place] [address]   Urine Culture     Status: None   Collection Time: 02/27/23  1:40 PM   Specimen: Urine, Random  Result Value Ref Range Status   Specimen Description   Final    URINE, RANDOM Performed at [facility], [address], [place], [place]  [address]    Special Requests   Final    NONE Reflexed from [id number] Performed at [facility], [address] [place], [place] [address]    Culture   Final    NO GROWTH Performed at [facility], [address], [place], [place] [address]    Report Status 03/01/2023 FINAL  Final    Labs: CBC: Recent Labs  Lab 02/28/23 0458 03/01/23 0432 03/02/23 0408 03/03/23 0525 03/04/23 0047  WBC 11.8* 12.8* 11.4* 13.0* 16.0*  HGB 10.1* 9.4* 9.6* 9.7* 12.4  HCT 32.4* 30.2* 29.8* 30.5* 38.8  MCV 88.8 89.9 90.6 88.7 91.3  PLT 168 137* 139* 139* 189   Basic Metabolic Panel: Recent Labs  Lab 02/28/23 0458 03/01/23 0432 03/02/23 0408 03/03/23 0525 03/04/23 0047 03/04/23 0822 03/05/23 0433 03/06/23 0429  NA 143   < > 141 138 140  --  138 136  K 3.7   < > 3.3* 3.8 3.4*  --  4.0 3.9  CL 111   < > 108 107 106  --  110 109  CO2 22   < > 25 23 23   --  21* 21*  GLUCOSE 107*   < > 89 97 87  --  107* 105*  BUN 33*   < > 16 14 11   --  11 13  CREATININE 0.61   < > 0.56 0.58 0.58  --  0.54 0.58  CALCIUM 10.2   < > 10.1 10.2 10.9*  --  11.0* 11.0*  MG 1.9  --  1.2*  --  1.3* 1.9  --   --    < > = values in this interval not displayed.   Liver Function Tests: Recent Labs  Lab 02/28/23 0458 03/01/23 0432 03/02/23 0408 03/03/23 0525 03/04/23 0047  AST 29 29 48* 42* 44*  ALT 33 34 48* 47* 51*  ALKPHOS 58 63 142* 119 128*  BILITOT 1.8* 1.5* 1.3* 1.1 0.9  PROT 5.2* 5.0* 4.9* 5.2* 5.7*  ALBUMIN 2.5* 2.3* 2.2* 2.2* 2.6*   CBG: No results for input(s): "GLUCAP" in the last 168 hours.  Discharge time spent: greater than 35 minutes.  Signed: Guadalupe Maple  Perlie Mayo, MD [facility] 03/06/2023

## 2023-03-06 NOTE — Care Management Important Message (Signed)
Important Message  Patient Details  Name: Carrie Fitzgerald MRN: [id number] Date of Birth: Oct 05, 1945   Important Message Given:  Yes - Medicare IM     Corey Harold 03/06/2023, 3:00 PM

## 2023-03-06 NOTE — TOC Progression Note (Signed)
Transition of Care [facility]) - Progression Note    Patient Details  Name: Carrie Fitzgerald MRN: [id number] Date of Birth: 09-23-1945  Transition of Care [facility]) CM/SW Contact  Villa Herb, [place] Phone Number: 03/06/2023, 11:25 AM  Clinical Narrative:    CSW spoke to [place] in admissions with [facility], insurance Berkley Harvey is still pending at this time. CSW sent updated notes from PT on 1/29 and requested that PT see pt again today if able. TOC to follow.   Expected Discharge Plan: Skilled Nursing Facility Barriers to Discharge: Insurance Authorization  Expected Discharge Plan and Services In-house Referral: Clinical Social Work Discharge Planning Services: CM Consult Post Acute Care Choice: Skilled Nursing Facility Living arrangements for the past 2 months: Single Family Home Expected Discharge Date: 03/05/23                                     Social Determinants of Health (SDOH) Interventions SDOH Screenings   Food Insecurity: No Food Insecurity (02/27/2023)  Housing: Unknown (02/27/2023)  Transportation Needs: No Transportation Needs (02/27/2023)  Utilities: Not At Risk (02/27/2023)  Social Connections: Unknown (02/28/2023)  Tobacco Use: Low Risk  (02/27/2023)    Readmission Risk Interventions    02/27/2023   12:39 PM  Readmission Risk Prevention Plan  Medication Screening Complete  Transportation Screening Complete

## 2023-03-06 NOTE — Plan of Care (Signed)
  Problem: Skin Integrity: Goal: Risk for impaired skin integrity will decrease Outcome: Progressing   

## 2023-03-06 NOTE — Discharge Planning (Signed)
RNCM received call from [facility] Medicare with authorization for skilled nursing.  Authorization #: [id number] Please call [phone] with any questions you may have.

## 2023-03-06 NOTE — TOC Transition Note (Signed)
Transition of Care [facility]) - Discharge Note   Patient Details  Name: Carrie Fitzgerald MRN: [id number] Date of Birth: 08/06/1945  Transition of Care [facility]) CM/SW Contact:  Villa Herb, LCSWA Phone Number: 03/06/2023, 3:41 PM   Clinical Narrative:    CSW updated that insurance Berkley Harvey has been approved for SNF at [facility] [place]. CSW spoke to [facility] in admissions who states they can accept today. CSW sent D/C clinicals in HUB. CSW updated pts son on plan for D/C. RN updated and will be provided with report number. Med necessity completed and sent to the floor. EMS called for transport.   Final next level of care: Skilled Nursing Facility Barriers to Discharge: Barriers Resolved   Patient Goals and CMS Choice Patient states their goals for this hospitalization and ongoing recovery are:: Go to SNF CMS Medicare.gov Compare Post Acute Care list provided to:: Patient Choice offered to / list presented to : Patient, Adult Children      Discharge Placement              Patient chooses bed at: [facility] Patient to be transferred to facility by: EMS Name of family member notified: Son Patient and family notified of of transfer: 03/06/23  Discharge Plan and Services Additional resources added to the After Visit Summary for   In-house Referral: Clinical Social Work Discharge Planning Services: CM Consult Post Acute Care Choice: Skilled Nursing Facility                               Social Drivers of Health (SDOH) Interventions SDOH Screenings   Food Insecurity: No Food Insecurity (02/27/2023)  Housing: Unknown (02/27/2023)  Transportation Needs: No Transportation Needs (02/27/2023)  Utilities: Not At Risk (02/27/2023)  Social Connections: Unknown (02/28/2023)  Tobacco Use: Low Risk  (02/27/2023)     Readmission Risk Interventions    02/27/2023   12:39 PM  Readmission Risk Prevention Plan  Medication Screening Complete  Transportation Screening Complete

## 2023-03-06 NOTE — Progress Notes (Signed)
Physical Therapy Treatment Patient Details Name: Carrie Fitzgerald MRN: [id number] DOB: [birth year] Today's Date: 03/06/2023   History of Present Illness Carrie Fitzgerald is a 78 y.o. female with medical history significant of Thyroid cancer status post recent biopsy who presents to the emergency department due to concern for dehydration.  Patient states that she has been having decreased fluid and food intake since she had biopsy for the thyroid cancer about 2 weeks ago.  She has been having increasing difficulty in being able to swallow food since the biopsy was done and patient has also been having increasing weakness.  She has lost about 12 pounds since onset of symptoms per caretaker at bedside.  She had a couple of episodes of nonbloody vomiting yesterday and has also had intermittent diarrhea.  She denies fever, chest pain, shortness of breath, cough    PT Comments  Patient had to use bed rail for rolling to side and sitting up from side lying position due to weakness, c/o discomfort over buttocks when sitting, demonstrated increased endurance for taking steps at bedside and required less assistance for transferring to/from [facility] and to chair.  Patient tolerated sitting up in chair after therapy. Patient will benefit from continued skilled physical therapy in hospital and recommended venue below to increase strength, balance, endurance for safe ADLs and gait.      If plan is discharge home, recommend the following: A lot of help with walking and/or transfers;A lot of help with bathing/dressing/bathroom;Assistance with cooking/housework;Help with stairs or ramp for entrance   Can travel by private vehicle     No  Equipment Recommendations  None recommended by PT    Recommendations for Other Services       Precautions / Restrictions Precautions Precautions: Fall Restrictions Weight Bearing Restrictions Per Provider Order: No     Mobility  Bed Mobility Overal bed mobility: Needs  Assistance Bed Mobility: Rolling, Sidelying to Sit Rolling: Mod assist Sidelying to sit: Mod assist       General bed mobility comments: increased time, labored movement    Transfers Overall transfer level: Needs assistance Equipment used: Rolling walker (2 wheels) Transfers: Sit to/from Stand, Bed to chair/wheelchair/BSC Sit to Stand: Min assist, Mod assist   Step pivot transfers: Min assist, Mod assist       General transfer comment: slow labored movement    Ambulation/Gait Ambulation/Gait assistance: Mod assist Gait Distance (Feet): 10 Feet Assistive device: Rolling walker (2 wheels) Gait Pattern/deviations: Trunk flexed, Decreased step length - right, Decreased step length - left, Step-to pattern, Decreased stride length Gait velocity: slow     General Gait Details: increased endurance for taking steps at bedside forward/backwards, limited mostly due to c/o fatigue, BLE weakness   Stairs             Wheelchair Mobility     Tilt Bed    Modified Rankin (Stroke Patients Only)       Balance Overall balance assessment: Needs assistance Sitting-balance support: Feet supported, No upper extremity supported Sitting balance-Leahy Scale: Fair Sitting balance - Comments: fair/good seated at EOB   Standing balance support: During functional activity, Bilateral upper extremity supported Standing balance-Leahy Scale: Poor Standing balance comment: fair/poor using RW                            Cognition Arousal: Alert Behavior During Therapy: WFL for tasks assessed/performed Overall Cognitive Status: Within Functional Limits for tasks assessed  Exercises General Exercises - Lower Extremity Long Arc Quad: Seated, AROM, Both, 10 reps Hip Flexion/Marching: Seated, AROM, Both, 10 reps Toe Raises: Seated, AROM, Both, 10 reps Heel Raises: Seated, AROM, Both, 10 reps    General Comments         Pertinent Vitals/Pain Pain Assessment Pain Assessment: Faces Faces Pain Scale: Hurts little more Pain Location: over buttocks, peri-anal area Pain Descriptors / Indicators: Discomfort, Sore, Guarding Pain Intervention(s): Limited activity within patient's tolerance, Monitored during session, Repositioned    Home Living                          Prior Function            PT Goals (current goals can now be found in the care plan section) Acute Rehab PT Goals Patient Stated Goal: return home after rehab PT Goal Formulation: With patient Time For Goal Achievement: 03/13/23 Potential to Achieve Goals: Good Progress towards PT goals: Progressing toward goals    Frequency    Min 3X/week      PT Plan      Co-evaluation              AM-PAC PT "6 Clicks" Mobility   Outcome Measure  Help needed turning from your back to your side while in a flat bed without using bedrails?: A Lot Help needed moving from lying on your back to sitting on the side of a flat bed without using bedrails?: A Lot Help needed moving to and from a bed to a chair (including a wheelchair)?: A Lot Help needed standing up from a chair using your arms (e.g., wheelchair or bedside chair)?: A Lot Help needed to walk in hospital room?: A Lot Help needed climbing 3-5 steps with a railing? : A Lot 6 Click Score: 12    End of Session   Activity Tolerance: Patient tolerated treatment well;Patient limited by fatigue Patient left: in chair;with call bell/phone within reach Nurse Communication: Mobility status PT Visit Diagnosis: Unsteadiness on feet (R26.81);Muscle weakness (generalized) (M62.81);Other abnormalities of gait and mobility (R26.89)     Time: 1610-9604 PT Time Calculation (min) (ACUTE ONLY): 35 min  Charges:    $Therapeutic Exercise: 8-22 mins $Therapeutic Activity: 8-22 mins PT General Charges $$ ACUTE PT VISIT: 1 Visit                     12:30 PM, 03/06/23 Ocie Bob, MPT Physical Therapist with [facility] [phone] office [phone] mobile phone

## 2023-04-02 ENCOUNTER — Ambulatory Visit: Payer: 59 | Admitting: "Endocrinology

## 2024-03-03 ENCOUNTER — Telehealth: Payer: Self-pay | Admitting: "Endocrinology

## 2024-03-03 ENCOUNTER — Other Ambulatory Visit: Payer: Self-pay | Admitting: "Endocrinology

## 2024-03-03 DIAGNOSIS — E042 Nontoxic multinodular goiter: Secondary | ICD-10-CM

## 2024-03-03 NOTE — Telephone Encounter (Signed)
 Tried to call pt but did not receive an answer and was unable to leave a message due to voicemail being full.

## 2024-03-03 NOTE — Telephone Encounter (Signed)
 Patient has been in a nursing home and is now out. She wants to make an appt. Is there any labs or anything you want prior?

## 2024-03-04 NOTE — Telephone Encounter (Signed)
 Tried to call pt but did not receive an answer and was unable to leave a message due to voicemail being full.

## 2024-03-08 NOTE — Telephone Encounter (Signed)
 Tried to call pt but was unable to receive an answer and was not able to leave a message due to voicemail being full.
